# Patient Record
Sex: Male | Born: 1950
Health system: Southern US, Community
[De-identification: ages and names within clinical notes are randomized; demographics above are authoritative.]

## PROBLEM LIST (undated history)

## (undated) DIAGNOSIS — F1020 Alcohol dependence, uncomplicated: Secondary | ICD-10-CM

## (undated) DIAGNOSIS — C61 Malignant neoplasm of prostate: Secondary | ICD-10-CM

## (undated) DIAGNOSIS — K219 Gastro-esophageal reflux disease without esophagitis: Secondary | ICD-10-CM

## (undated) DIAGNOSIS — F32A Depression, unspecified: Secondary | ICD-10-CM

## (undated) HISTORY — DX: Depression, unspecified: F32.A

## (undated) HISTORY — PX: NOSE SURGERY: SHX723

## (undated) HISTORY — DX: Alcohol dependence, uncomplicated: F10.20

---

## 2009-06-08 ENCOUNTER — Emergency Department (HOSPITAL_COMMUNITY): Admission: EM | Admit: 2009-06-08 | Discharge: 2009-06-08 | Payer: Self-pay | Admitting: Emergency Medicine

## 2011-10-10 ENCOUNTER — Emergency Department (HOSPITAL_COMMUNITY): Payer: Self-pay

## 2011-10-10 ENCOUNTER — Emergency Department (HOSPITAL_COMMUNITY)
Admission: EM | Admit: 2011-10-10 | Discharge: 2011-10-10 | Disposition: A | Discharge status: Home / Self Care | Payer: Self-pay | Attending: Emergency Medicine | Admitting: Emergency Medicine

## 2011-10-10 ENCOUNTER — Encounter (HOSPITAL_COMMUNITY): Payer: Self-pay | Admitting: *Deleted

## 2011-10-10 DIAGNOSIS — S61259A Open bite of unspecified finger without damage to nail, initial encounter: Secondary | ICD-10-CM

## 2011-10-10 DIAGNOSIS — W540XXA Bitten by dog, initial encounter: Secondary | ICD-10-CM | POA: Insufficient documentation

## 2011-10-10 DIAGNOSIS — S61209A Unspecified open wound of unspecified finger without damage to nail, initial encounter: Secondary | ICD-10-CM | POA: Insufficient documentation

## 2011-10-10 DIAGNOSIS — Y92009 Unspecified place in unspecified non-institutional (private) residence as the place of occurrence of the external cause: Secondary | ICD-10-CM | POA: Insufficient documentation

## 2011-10-10 DIAGNOSIS — K219 Gastro-esophageal reflux disease without esophagitis: Secondary | ICD-10-CM | POA: Insufficient documentation

## 2011-10-10 DIAGNOSIS — F17200 Nicotine dependence, unspecified, uncomplicated: Secondary | ICD-10-CM | POA: Insufficient documentation

## 2011-10-10 HISTORY — DX: Gastro-esophageal reflux disease without esophagitis: K21.9

## 2011-10-10 MED ORDER — AMOXICILLIN-POT CLAVULANATE 875-125 MG PO TABS: 1.0000 | ORAL_TABLET | Freq: Two times a day (BID) | ORAL | Status: AC

## 2011-10-10 MED ORDER — HYDROCODONE-ACETAMINOPHEN 5-325 MG PO TABS: ORAL_TABLET | ORAL | Status: DC

## 2011-10-10 MED ORDER — ONDANSETRON HCL 4 MG PO TABS
4.0000 mg | ORAL_TABLET | Freq: Once | ORAL | Status: AC
  Administered 2011-10-10: 4 mg via ORAL
  Filled 2011-10-10: qty 1

## 2011-10-10 MED ORDER — AMOXICILLIN-POT CLAVULANATE 875-125 MG PO TABS
1.0000 | ORAL_TABLET | Freq: Once | ORAL | Status: AC
  Administered 2011-10-10: 1 via ORAL
  Filled 2011-10-10: qty 1

## 2011-10-10 MED ORDER — BACITRACIN ZINC 500 UNIT/GM EX OINT
TOPICAL_OINTMENT | CUTANEOUS | Status: AC
  Filled 2011-10-10: qty 0.9

## 2011-10-10 MED ORDER — DIPHTH-ACELL PERTUSSIS-TETANUS 25-58-10 LF-MCG/0.5 IM SUSP
0.5000 mL | Freq: Once | INTRAMUSCULAR | Status: AC
  Administered 2011-10-10: 0.5 mL via INTRAMUSCULAR
  Filled 2011-10-10: qty 0.5

## 2011-10-10 MED ORDER — HYDROCODONE-ACETAMINOPHEN 5-325 MG PO TABS
2.0000 | ORAL_TABLET | Freq: Once | ORAL | Status: AC
  Administered 2011-10-10: 2 via ORAL
  Filled 2011-10-10: qty 2

## 2011-10-10 MED ORDER — BACITRACIN-NEOMYCIN-POLYMYXIN 400-5-5000 EX OINT
TOPICAL_OINTMENT | Freq: Once | CUTANEOUS | Status: AC
  Administered 2011-10-10: 12:00:00 via TOPICAL

## 2011-10-10 NOTE — ED Notes (Signed)
Cleansed hand and wound.  Bacitracin, telfa,bandage, Splint applied to protect wound.

## 2011-10-10 NOTE — ED Notes (Signed)
Animal control spoke with pt by phone.

## 2011-10-10 NOTE — Discharge Instructions (Signed)
Please cleanse the wound daily with soap and water. Apply dressing and splint until healed. Keep wound clean and dry. Please call Dr Romeo Apple for follow up of the finger in the office. Please return to the Emergency Dept on Sunday, April 28, for recheck.Animal Bite An animal bite can result in a scratch on the skin, deep open cut, puncture of the skin, crush injury, or tearing away of the skin or a body part. Dogs are responsible for most animal bites. Children are bitten more often than adults. An animal bite can range from very mild to more serious. A small bite from your house pet is no cause for alarm. However, some animal bites can become infected or injure a bone or other tissue. You must seek medical care if:  The skin is broken and bleeding does not slow down or stop after 15 minutes.   The puncture is deep and difficult to clean (such as a cat bite).   Pain, warmth, redness, or pus develops around the wound.   The bite is from a stray animal or rodent. There may be a risk of rabies infection.   The bite is from a snake, raccoon, skunk, fox, coyote, or bat. There may be a risk of rabies infection.   The person bitten has a chronic illness such as diabetes, liver disease, or cancer, or the person takes medicine that lowers the immune system.   There is concern about the location and severity of the bite.  It is important to clean and protect an animal bite wound right away to prevent infection. Follow these steps:  Clean the wound with plenty of water and soap.   Apply an antibiotic cream.   Apply gentle pressure over the wound with a clean towel or gauze to slow or stop bleeding.   Elevate the affected area above the heart to help stop any bleeding.   Seek medical care. Getting medical care within 8 hours of the animal bite leads to the best possible outcome.  DIAGNOSIS  Your caregiver will most likely:  Take a detailed history of the animal and the bite injury.   Perform a  wound exam.   Take your medical history.  Blood tests or X-rays may be performed. Sometimes, infected bite wounds are cultured and sent to a lab to identify the infectious bacteria.  TREATMENT  Medical treatment will depend on the location and type of animal bite as well as the patient's medical history. Treatment may include:  Wound care, such as cleaning and flushing the wound with saline solution, bandaging, and elevating the affected area.   Antibiotics.   Tetanus immunization.   Rabies immunization.   Leaving the wound open to heal. This is often done with animal bites, due to the high risk of infection. However, in certain cases, wound closure with stitches, wound adhesive, skin adhesive strips, or staples may be used.  Infected bites that are left untreated may require intravenous (IV) antibiotics and surgical treatment in the hospital. HOME CARE INSTRUCTIONS  Follow your caregiver's instructions for wound care.   Take all medicines as directed.   If your caregiver prescribes antibiotics, take them as directed. Finish them even if you start to feel better.   Follow up with your caregiver for further exams or immunizations as directed.  You may need a tetanus shot if:  You cannot remember when you had your last tetanus shot.   You have never had a tetanus shot.   The injury broke  your skin.  If you get a tetanus shot, your arm may swell, get red, and feel warm to the touch. This is common and not a problem. If you need a tetanus shot and you choose not to have one, there is a rare chance of getting tetanus. Sickness from tetanus can be serious. SEEK MEDICAL CARE IF:  You notice warmth, redness, soreness, swelling, pus discharge, or a bad smell coming from the wound.   You have a red line on the skin coming from the wound.   You have a fever, chills, or a general ill feeling.   You have nausea or vomiting.   You have continued or worsening pain.   You have trouble  moving the injured part.   You have other questions or concerns.  MAKE SURE YOU:  Understand these instructions.   Will watch your condition.   Will get help right away if you are not doing well or get worse.  Document Released: 02/18/2011 Document Revised: 05/22/2011 Document Reviewed: 02/18/2011 [facility] Patient Information 2012 [place], [place].

## 2011-10-10 NOTE — ED Notes (Signed)
Pt alert, NAD, dog bite to rt middle finger. Area is bandaged and no bleeding at present.

## 2011-10-10 NOTE — ED Provider Notes (Signed)
History     CSN: [id number]  Arrival date & time 10/10/11  1005   First MD Initiated Contact with Patient 10/10/11 1058      Chief Complaint  Patient presents with  . Animal Bite    (Consider location/radiation/quality/duration/timing/severity/associated sxs/prior treatment) HPI Comments: Dog owner reports the dog is UTD on all shots.  Patient is a 61 y.o. male presenting with animal bite. The history is provided by the patient.  Animal Bite  The incident occurred just prior to arrival. The incident occurred at another residence. He came to the ER via personal transport. Head/neck injury location: none. Torso Injury Location: none. There is an injury to the right long finger. Leg Injury Location: none. Toe Injury Location: none. The pain is moderate. It is unlikely that a foreign body is present. Pertinent negatives include no chest pain, no numbness, no abdominal pain, no nausea, no vomiting, no bladder incontinence, no neck pain, no focal weakness, no seizures, no tingling and no cough. There have been no prior injuries to these areas. He is right-handed. His tetanus status is out of date. He has been behaving normally. He has received no recent medical care.    Past Medical History  Diagnosis Date  . GERD (gastroesophageal reflux disease)     History reviewed. No pertinent past surgical history.  No family history on file.  History  Substance Use Topics  . Smoking status: Current Everyday Smoker  . Smokeless tobacco: Not on file  . Alcohol Use: Yes      Review of Systems  Constitutional: Negative for activity change.       All ROS Neg except as noted in HPI  HENT: Negative for nosebleeds and neck pain.   Eyes: Negative for photophobia and discharge.  Respiratory: Negative for cough, shortness of breath and wheezing.   Cardiovascular: Negative for chest pain and palpitations.  Gastrointestinal: Negative for nausea, vomiting, abdominal pain and blood in stool.    Genitourinary: Negative for bladder incontinence, dysuria, frequency and hematuria.  Musculoskeletal: Negative for back pain and arthralgias.  Skin: Negative.   Neurological: Negative for dizziness, tingling, focal weakness, seizures, speech difficulty and numbness.  Psychiatric/Behavioral: Negative for hallucinations and confusion.    Allergies  Review of patient's allergies indicates no known allergies.  Home Medications   Current Outpatient Rx  Name Route Sig Dispense Refill  . AMOXICILLIN-POT CLAVULANATE 875-125 MG PO TABS Oral Take 1 tablet by mouth 2 (two) times daily after a meal. 14 tablet 0  . HYDROCODONE-ACETAMINOPHEN 5-325 MG PO TABS  1 or 2 po q4h prn pain, take with food 20 tablet 0    BP 123/91  Pulse 80  Temp 97.8 F (36.6 C)  Resp 18  Ht 5' 8.5" (1.74 m)  Wt 135 lb (61.236 kg)  BMI 20.23 kg/m2  SpO2 100%  Physical Exam  Nursing note and vitals reviewed. Constitutional: He is oriented to person, place, and time. He appears well-developed and well-nourished.  Non-toxic appearance.  HENT:  Head: Normocephalic.  Right Ear: Tympanic membrane and external ear normal.  Left Ear: Tympanic membrane and external ear normal.  Eyes: EOM and lids are normal. Pupils are equal, round, and reactive to light.  Neck: Normal range of motion. Neck supple. Carotid bruit is not present.  Cardiovascular: Normal rate, regular rhythm, normal heart sounds, intact distal pulses and normal pulses.   Pulmonary/Chest: Breath sounds normal. No respiratory distress.  Abdominal: Soft. Bowel sounds are normal. There is no tenderness. There is  no guarding.  Musculoskeletal: Normal range of motion.       The tip of the right middle finger is denuded. There is a laceration of the distal tip. No bone involvement seen. Good ROM noted.  Lymphadenopathy:       Head (right side): No submandibular adenopathy present.       Head (left side): No submandibular adenopathy present.    He has no  cervical adenopathy.  Neurological: He is alert and oriented to person, place, and time. He has normal strength. No cranial nerve deficit or sensory deficit.  Skin: Skin is warm and dry.  Psychiatric: He has a normal mood and affect. His speech is normal.    ED Course  Procedures (including critical care time)  Labs Reviewed - No data to display Dg Finger Middle Right  10/10/2011  *RADIOLOGY REPORT*  Clinical Data: Dog bite to middle finger.  Pain and swelling.  RIGHT MIDDLE FINGER 2+V  Comparison: None.  Findings: Soft tissue swelling is seen, however there is no evidence of soft tissue gas or radiopaque foreign body.  No evidence of fracture or other bone abnormality.  IMPRESSION: Soft tissue swelling.  No evidence of fracture or radiopaque foreign body.  Original Report Authenticated By: Danae Orleans, M.D.     1. Animal bite of finger       MDM  I have reviewed nursing notes, vital signs, and all appropriate lab and imaging results for this patient. Xray negative for fracture or fb. Pt to return on 4/28 for recheck.Rx for augmentin and norco given to the patient.       Kathie Dike, PA 10/10/11 1200  Kathie Dike, [place] 10/10/11 1230

## 2011-10-10 NOTE — ED Notes (Signed)
Pt was working in the yard when the dog grabbed the glove that he had own biting through the glove and into the right middle finger, bandage applied, bleeding controlled at present,  Pt states that his last tetanus shot was >5 years ago and that the dog has had all his shots per the owner of the dog, Joseph Gentry.

## 2011-10-11 NOTE — ED Provider Notes (Signed)
Evaluation and management procedures were performed by the PA/NP/resident physician under my supervision/collaboration.   Brett Darko D Shameer Molstad, MD 10/11/11 2055 

## 2011-10-12 ENCOUNTER — Encounter (HOSPITAL_COMMUNITY): Payer: Self-pay | Admitting: *Deleted

## 2011-10-12 ENCOUNTER — Emergency Department (HOSPITAL_COMMUNITY)
Admission: EM | Admit: 2011-10-12 | Discharge: 2011-10-12 | Disposition: A | Discharge status: Home / Self Care | Payer: Self-pay | Attending: Emergency Medicine | Admitting: Emergency Medicine

## 2011-10-12 DIAGNOSIS — K219 Gastro-esophageal reflux disease without esophagitis: Secondary | ICD-10-CM | POA: Insufficient documentation

## 2011-10-12 DIAGNOSIS — S61259A Open bite of unspecified finger without damage to nail, initial encounter: Secondary | ICD-10-CM

## 2011-10-12 DIAGNOSIS — IMO0002 Reserved for concepts with insufficient information to code with codable children: Secondary | ICD-10-CM | POA: Insufficient documentation

## 2011-10-12 DIAGNOSIS — W540XXA Bitten by dog, initial encounter: Secondary | ICD-10-CM | POA: Insufficient documentation

## 2011-10-12 DIAGNOSIS — F17200 Nicotine dependence, unspecified, uncomplicated: Secondary | ICD-10-CM | POA: Insufficient documentation

## 2011-10-12 NOTE — ED Provider Notes (Signed)
History    This chart was scribed for Osvaldo Human, MD by Toya Smothers. The patient was seen in room APA17/APA17. Patient's care was started at 1006.   CSN: [id number]  Arrival date & time 10/12/11  1006   First MD Initiated Contact with Patient 10/12/11 1019      Chief Complaint  Patient presents with  . Wound Check    (Consider location/radiation/quality/duration/timing/severity/associated sxs/prior treatment) HPI Joseph Gentry is a 61 y.o. male who presents to the Emergency Department to reevaluate wound on the tip of the right third finger from dog bite 2 days ago. Patient was seen 2 days ago for an animal bite and prescribed Augmentin and told to return for a reevaluation today. Patient denies fever and chills. Patient is scheduled to follow up with Dr. Romeo Apple.   Past Medical History  Diagnosis Date  . GERD (gastroesophageal reflux disease)     History reviewed. No pertinent past surgical history.  No family history on file.  History  Substance Use Topics  . Smoking status: Current Everyday Smoker  . Smokeless tobacco: Not on file  . Alcohol Use: Yes      Review of Systems  Constitutional: Negative for fever and chills.  HENT: Negative for rhinorrhea and neck pain.   Eyes: Negative for pain.  Respiratory: Negative for cough and shortness of breath.   Cardiovascular: Negative for chest pain.  Gastrointestinal: Negative for nausea, vomiting, abdominal pain and diarrhea.  Genitourinary: Negative for dysuria.  Musculoskeletal: Negative for back pain.  Skin: Positive for wound. Negative for rash.  Neurological: Negative for dizziness and weakness.    Allergies  Review of patient's allergies indicates no known allergies.  Home Medications   Current Outpatient Rx  Name Route Sig Dispense Refill  . AMOXICILLIN-POT CLAVULANATE 875-125 MG PO TABS Oral Take 1 tablet by mouth 2 (two) times daily after a meal. 14 tablet 0  . HYDROCODONE-ACETAMINOPHEN 5-325  MG PO TABS Oral Take 1 tablet by mouth every 4 (four) hours as needed. Pain    . OMEPRAZOLE 20 MG PO CPDR Oral Take 20 mg by mouth daily as needed. Stomach Acid      BP 116/77  Pulse 87  Temp(Src) 97.5 F (36.4 C) (Oral)  Resp 20  SpO2 94%  Physical Exam  Nursing note and vitals reviewed. Constitutional: He is oriented to person, place, and time. He appears well-developed and well-nourished. No distress.  HENT:  Head: Normocephalic and atraumatic.  Eyes: EOM are normal. Pupils are equal, round, and reactive to light.  Neck: Neck supple. No tracheal deviation present.  Cardiovascular: Normal rate.   Pulmonary/Chest: Effort normal. No respiratory distress.  Abdominal: Soft. He exhibits no distension.  Musculoskeletal: Normal range of motion. He exhibits no edema.  Neurological: He is alert and oriented to person, place, and time. No sensory deficit.  Skin: Skin is warm and dry.       Wound on right third finger with no signs of infection  Psychiatric: He has a normal mood and affect. His behavior is normal.    ED Course  Procedures (including critical care time) DIAGNOSTIC STUDIES: Oxygen Saturation is 94% on room air, low by my interpretation.    COORDINATION OF CARE:  10:40AM Reevaluation redressing of wound  Labs Reviewed - No data to display Dg Finger Middle Right  10/10/2011  *RADIOLOGY REPORT*  Clinical Data: Dog bite to middle finger.  Pain and swelling.  RIGHT MIDDLE FINGER 2+V  Comparison: None.  Findings:  Soft tissue swelling is seen, however there is no evidence of soft tissue gas or radiopaque foreign body.  No evidence of fracture or other bone abnormality.  IMPRESSION: Soft tissue swelling.  No evidence of fracture or radiopaque foreign body.  Original Report Authenticated By: Danae Orleans, M.D.    Exam of pt's fingertip shows no evidence of infection.  Redressed with Telfa and Tubegauze.  Advised followup with Dr. Romeo Apple, orthopedist to whom he was referred  when seen initially.      1. Dog bite of finger   2. Fingertip amputation     I personally performed the services described in this documentation, which was scribed in my presence. The recorded information has been reviewed and considered.  Osvaldo Human, M.D     Carleene Cooper III, MD 10/12/11 2108

## 2011-10-12 NOTE — Discharge Instructions (Signed)
Finger Avulsion  When the tip of the finger is lost, a new nail may grow back if part of the fingernail is left. The new nail may be deformed. If just the tip of the finger is lost, no repair may be needed unless there is bone showing. If bone is showing, your caregiver may need to remove the protruding bone and put on a bandage. Your caregiver will do what is best for you. Most of the time when a fingertip is lost, the end will gradually grow back on and look fairly normal, but it may remain sensitive to pressure and temperature extremes for a long time. HOME CARE INSTRUCTIONS   Keep your hand elevated above your heart to relieve pain and swelling.   Keep your dressing dry and clean.   Change your bandage in 24 hours or as directed.   After your bandage is changed, soak your hand in warm soapy water for 10 to 15 minutes. Do this 3 times per day. This helps reduce pain and swelling.   After soaking your hand, apply a clean, dry bandage. Change your bandage if it is wet or dirty.   Only take over-the-counter or prescription medicines for pain, discomfort, or fever as directed by your caregiver.   See your caregiver as needed for problems.  SEEK MEDICAL CARE IF:   You have increased pain, swelling, drainage, or bleeding.   You have a fever.   You have swelling that spreads from your finger and into your hand.  Make sure to check to see if you need a tetanus booster. Document Released: 08/11/2001 Document Revised: 05/22/2011 Document Reviewed: 07/06/2008 [employer] Patient Information 2012 [employer]. 

## 2011-10-12 NOTE — ED Notes (Signed)
Bitten by a dog on Friday to left 2nd finger. Here for recheck.

## 2011-10-15 ENCOUNTER — Encounter: Payer: Self-pay | Admitting: Orthopedic Surgery

## 2011-10-15 ENCOUNTER — Ambulatory Visit (INDEPENDENT_AMBULATORY_CARE_PROVIDER_SITE_OTHER): Payer: Self-pay | Admitting: Orthopedic Surgery

## 2011-10-15 VITALS — Ht 68.5 in | Wt 135.0 lb

## 2011-10-15 DIAGNOSIS — T148XXA Other injury of unspecified body region, initial encounter: Secondary | ICD-10-CM

## 2011-10-15 DIAGNOSIS — S61209A Unspecified open wound of unspecified finger without damage to nail, initial encounter: Secondary | ICD-10-CM

## 2011-10-15 DIAGNOSIS — W540XXA Bitten by dog, initial encounter: Secondary | ICD-10-CM

## 2011-10-15 DIAGNOSIS — S61259A Open bite of unspecified finger without damage to nail, initial encounter: Secondary | ICD-10-CM

## 2011-10-15 MED ORDER — HYDROCODONE-ACETAMINOPHEN 5-325 MG PO TABS: 1.0000 | ORAL_TABLET | ORAL | Status: DC | PRN

## 2011-10-15 NOTE — Patient Instructions (Signed)
Keep clean and dry

## 2011-10-15 NOTE — Progress Notes (Signed)
  Subjective:    Joseph Gentry is a 61 y.o. male who presents for evaluation of dog bite, RIGHT long finger  Date of injury April 26.  The patient was doing some yard work and the dog bit his finger avulsing the volar tip of the finger soft tissue only. He is on antibiotic. He had a tetanus shot is up to him pain medicine. He is here for evaluation. Pain is 3/10. Timing is intermittent and symptoms/description    Systems he does complain of some cough and heartburn.  Evaluation of the digit, RIGHT long finger, volar aspect is avulsed soft tissue only, clean granulation tissue, and no deficits in terms of range of motion or flexion or extension. Power  Neurovascular exam intact   Redressed with Xeroform and gauze.  Dressing change in one week continue antibiotic

## 2011-10-22 ENCOUNTER — Ambulatory Visit (INDEPENDENT_AMBULATORY_CARE_PROVIDER_SITE_OTHER): Payer: Self-pay | Admitting: Orthopedic Surgery

## 2011-10-22 ENCOUNTER — Encounter: Payer: Self-pay | Admitting: Orthopedic Surgery

## 2011-10-22 VITALS — BP 106/62 | Ht 68.5 in | Wt 135.0 lb

## 2011-10-22 DIAGNOSIS — T148XXA Other injury of unspecified body region, initial encounter: Secondary | ICD-10-CM

## 2011-10-22 DIAGNOSIS — S61209A Unspecified open wound of unspecified finger without damage to nail, initial encounter: Secondary | ICD-10-CM

## 2011-10-22 DIAGNOSIS — W540XXA Bitten by dog, initial encounter: Secondary | ICD-10-CM

## 2011-10-22 NOTE — Patient Instructions (Signed)
Change daily

## 2011-10-22 NOTE — Progress Notes (Signed)
Patient ID: Joseph Gentry, male   DOB: 09/27/1950, 62 y.o.   MRN: [id number] Chief Complaint  Patient presents with  . Follow-up    1 week recheck and dressing change on right long finger.    The finger has improved significantly in the tissue is granulating well.  We will put a splint on and change the dressing. He should change dressing once a day, apply a Band-Aid and splint. Follow up in 2 weeks

## 2011-11-05 ENCOUNTER — Ambulatory Visit: Payer: Self-pay | Admitting: Orthopedic Surgery

## 2015-12-18 ENCOUNTER — Emergency Department (HOSPITAL_COMMUNITY)
Admission: EM | Admit: 2015-12-18 | Discharge: 2015-12-18 | Disposition: A | Discharge status: Home / Self Care | Payer: Medicare Other | Attending: Emergency Medicine | Admitting: Emergency Medicine

## 2015-12-18 ENCOUNTER — Emergency Department (HOSPITAL_COMMUNITY): Payer: Medicare Other

## 2015-12-18 ENCOUNTER — Encounter (HOSPITAL_COMMUNITY): Payer: Self-pay | Admitting: Emergency Medicine

## 2015-12-18 DIAGNOSIS — Z79899 Other long term (current) drug therapy: Secondary | ICD-10-CM | POA: Diagnosis not present

## 2015-12-18 DIAGNOSIS — F17210 Nicotine dependence, cigarettes, uncomplicated: Secondary | ICD-10-CM | POA: Diagnosis not present

## 2015-12-18 DIAGNOSIS — R0602 Shortness of breath: Secondary | ICD-10-CM | POA: Insufficient documentation

## 2015-12-18 DIAGNOSIS — Z7982 Long term (current) use of aspirin: Secondary | ICD-10-CM | POA: Diagnosis not present

## 2015-12-18 DIAGNOSIS — R079 Chest pain, unspecified: Secondary | ICD-10-CM | POA: Diagnosis not present

## 2015-12-18 DIAGNOSIS — R1013 Epigastric pain: Secondary | ICD-10-CM | POA: Insufficient documentation

## 2015-12-18 LAB — COMPREHENSIVE METABOLIC PANEL
ALBUMIN: 4 g/dL (ref 3.5–5.0)
ALT: 17 U/L (ref 17–63)
AST: 31 U/L (ref 15–41)
Alkaline Phosphatase: 96 U/L (ref 38–126)
Anion gap: 12 (ref 5–15)
BUN: 6 mg/dL (ref 6–20)
CHLORIDE: 101 mmol/L (ref 101–111)
CO2: 24 mmol/L (ref 22–32)
CREATININE: 0.99 mg/dL (ref 0.61–1.24)
Calcium: 9.8 mg/dL (ref 8.9–10.3)
GFR calc Af Amer: >60 mL/min (ref >60)
GFR calc non Af Amer: >60 mL/min (ref >60)
GLUCOSE: 82 mg/dL (ref 65–99)
POTASSIUM: 3.4 mmol/L — AB (ref 3.5–5.1)
Sodium: 137 mmol/L (ref 135–145)
Total Bilirubin: 0.4 mg/dL (ref 0.3–1.2)
Total Protein: 7.1 g/dL (ref 6.5–8.1)

## 2015-12-18 LAB — CBC WITH DIFFERENTIAL/PLATELET
BASOS ABS: 0.1 10*3/uL (ref 0.0–0.1)
Basophils Relative: 1 %
EOS PCT: 5 %
Eosinophils Absolute: 0.4 10*3/uL (ref 0.0–0.7)
HEMATOCRIT: 40 % (ref 39.0–52.0)
Hemoglobin: 14 g/dL (ref 13.0–17.0)
LYMPHS ABS: 1.7 10*3/uL (ref 0.7–4.0)
LYMPHS PCT: 26 %
MCH: 35.1 pg — AB (ref 26.0–34.0)
MCHC: 35 g/dL (ref 30.0–36.0)
MCV: 100.3 fL — AB (ref 78.0–100.0)
MONO ABS: 0.6 10*3/uL (ref 0.1–1.0)
MONOS PCT: 10 %
NEUTROS ABS: 3.7 10*3/uL (ref 1.7–7.7)
Neutrophils Relative %: 58 %
Platelets: 229 10*3/uL (ref 150–400)
RBC: 3.99 MIL/uL — ABNORMAL LOW (ref 4.22–5.81)
RDW: 13.8 % (ref 11.5–15.5)
WBC: 6.4 10*3/uL (ref 4.0–10.5)

## 2015-12-18 LAB — I-STAT TROPONIN, ED: TROPONIN I, POC: 0.01 ng/mL (ref 0.00–0.08)

## 2015-12-18 LAB — ETHANOL: Alcohol, Ethyl (B): 56 mg/dL — ABNORMAL HIGH (ref <5)

## 2015-12-18 LAB — LIPASE, BLOOD: LIPASE: 20 U/L (ref 11–51)

## 2015-12-18 MED ORDER — ONDANSETRON HCL 4 MG/2ML IJ SOLN
4.0000 mg | Freq: Once | INTRAMUSCULAR | Status: AC
  Administered 2015-12-18: 4 mg via INTRAVENOUS
  Filled 2015-12-18: qty 2

## 2015-12-18 MED ORDER — SODIUM CHLORIDE 0.9 % IV BOLUS (SEPSIS)
500.0000 mL | Freq: Once | INTRAVENOUS | Status: AC
  Administered 2015-12-18: 500 mL via INTRAVENOUS

## 2015-12-18 MED ORDER — DIATRIZOATE MEGLUMINE & SODIUM 66-10 % PO SOLN
ORAL | Status: DC
Start: 2015-12-18 — End: 2015-12-19
  Filled 2015-12-18: qty 30

## 2015-12-18 MED ORDER — STERILE WATER FOR INJECTION IJ SOLN
INTRAMUSCULAR | Status: AC
  Administered 2015-12-18: 21:00:00
  Filled 2015-12-18: qty 10

## 2015-12-18 MED ORDER — IOPAMIDOL (ISOVUE-300) INJECTION 61%
100.0000 mL | Freq: Once | INTRAVENOUS | Status: AC | PRN
  Administered 2015-12-18: 100 mL via INTRAVENOUS

## 2015-12-18 MED ORDER — FAMOTIDINE 20 MG PO TABS: 20.0000 mg | ORAL_TABLET | Freq: Two times a day (BID) | ORAL | Status: DC

## 2015-12-18 MED ORDER — PANTOPRAZOLE SODIUM 40 MG IV SOLR
40.0000 mg | Freq: Once | INTRAVENOUS | Status: AC
  Administered 2015-12-18: 40 mg via INTRAVENOUS
  Filled 2015-12-18: qty 40

## 2015-12-18 MED ORDER — SODIUM CHLORIDE 0.9 % IV SOLN
INTRAVENOUS | Status: DC
Start: 2015-12-18 — End: 2015-12-19
  Administered 2015-12-18: 19:00:00 via INTRAVENOUS

## 2015-12-18 NOTE — ED Notes (Signed)
Pt is nondiaphoretic. Nad. Mm moist.

## 2015-12-18 NOTE — ED Provider Notes (Signed)
CSN: [id number]     Arrival date & time 12/18/15  1701 History   First MD Initiated Contact with Patient 12/18/15 1710     Chief Complaint  Patient presents with  . Chest Pain     (Consider location/radiation/quality/duration/timing/severity/associated sxs/prior Treatment) Patient is a 65 y.o. male presenting with chest pain. The history is provided by the patient.  Chest Pain Associated symptoms: abdominal pain, diaphoresis, fever, nausea and shortness of breath   Associated symptoms: no back pain, no headache and not vomiting   Patient with 3-4 day complaint of epigastric abdominal pain that radiates in the lower chest into the lower abdomen. Patient's had a history of gastric ulcer in the past. Patient drinks alcohol daily. Patient states that he's had nausea without vomiting, no diarrhea, has had diaphoresis and has had a fever feeling. Also showed initiated with shortness of breath  Past Medical History  Diagnosis Date  . GERD (gastroesophageal reflux disease)    History reviewed. No pertinent past surgical history. History reviewed. No pertinent family history. Social History  Substance Use Topics  . Smoking status: Current Every Day Smoker -- 1.00 packs/day    Types: Cigarettes  . Smokeless tobacco: None  . Alcohol Use: Yes     Comment: daily    Review of Systems  Constitutional: Positive for fever and diaphoresis.  HENT: Negative for congestion.   Eyes: Negative for visual disturbance.  Respiratory: Positive for shortness of breath.   Cardiovascular: Positive for chest pain. Negative for leg swelling.  Gastrointestinal: Positive for nausea and abdominal pain. Negative for vomiting.  Genitourinary: Negative for dysuria.  Musculoskeletal: Negative for back pain.  Skin: Negative for rash.  Neurological: Negative for headaches.  Hematological: Does not bruise/bleed easily.  Psychiatric/Behavioral: Negative for confusion.      Allergies  Review of patient's  allergies indicates no known allergies.  Home Medications   Prior to Admission medications   Medication Sig Start Date End Date Taking? Authorizing Provider  Aspirin-Salicylamide-Caffeine (BC HEADACHE) 325-95-16 MG TABS Take 1 packet by mouth daily as needed (for pain).   Yes Historical Provider, MD  ibuprofen (ADVIL,MOTRIN) 200 MG tablet Take 200-400 mg by mouth every 6 (six) hours as needed for moderate pain.   Yes Historical Provider, MD  ranitidine (ZANTAC) 75 MG tablet Take 75 mg by mouth daily as needed for heartburn.   Yes Historical Provider, MD  famotidine (PEPCID) 20 MG tablet Take 1 tablet (20 mg total) by mouth 2 (two) times daily. 12/18/15   Fredia Sorrow, MD   BP 112/75 mmHg  Pulse 77  Temp(Src) 97.5 F (36.4 C) (Oral)  Resp 18  Ht 5\' 8"  (1.727 m)  Wt 56.7 kg  BMI 19.01 kg/m2  SpO2 98% Physical Exam  Constitutional: He is oriented to person, place, and time. He appears well-developed and well-nourished. No distress.  HENT:  Head: Normocephalic and atraumatic.  Mouth/Throat: Oropharynx is clear and moist.  Eyes: Conjunctivae and EOM are normal. Pupils are equal, round, and reactive to light.  Neck: Normal range of motion. Neck supple.  Cardiovascular: Normal rate, regular rhythm and normal heart sounds.   No murmur heard. Pulmonary/Chest: Effort normal and breath sounds normal. No respiratory distress.  Abdominal: Soft. Bowel sounds are normal. There is no tenderness.  Musculoskeletal: Normal range of motion. He exhibits no edema.  Neurological: He is alert and oriented to person, place, and time. No cranial nerve deficit. He exhibits normal muscle tone. Coordination normal.  Skin: Skin is warm. No  rash noted.  Nursing note and vitals reviewed.   ED Course  Procedures (including critical care time) Labs Review Labs Reviewed  COMPREHENSIVE METABOLIC PANEL - Abnormal; Notable for the following:    Potassium 3.4 (*)    All other components within normal limits   CBC WITH DIFFERENTIAL/PLATELET - Abnormal; Notable for the following:    RBC 3.99 (*)    MCV 100.3 (*)    MCH 35.1 (*)    All other components within normal limits  ETHANOL - Abnormal; Notable for the following:    Alcohol, Ethyl (B) 56 (*)    All other components within normal limits  LIPASE, BLOOD  URINALYSIS, ROUTINE W REFLEX MICROSCOPIC (NOT AT [facility])  URINE RAPID DRUG SCREEN, HOSP PERFORMED  I-STAT TROPOININ, ED  I-STAT CHEM 8, ED  I-STAT TROPOININ, ED   Results for orders placed or performed during the hospital encounter of 12/18/15  Comprehensive metabolic panel  Result Value Ref Range   Sodium 137 135 - 145 mmol/L   Potassium 3.4 (L) 3.5 - 5.1 mmol/L   Chloride 101 101 - 111 mmol/L   CO2 24 22 - 32 mmol/L   Glucose, Bld 82 65 - 99 mg/dL   BUN 6 6 - 20 mg/dL   Creatinine, Ser 0.99 0.61 - 1.24 mg/dL   Calcium 9.8 8.9 - 10.3 mg/dL   Total Protein 7.1 6.5 - 8.1 g/dL   Albumin 4.0 3.5 - 5.0 g/dL   AST 31 15 - 41 U/L   ALT 17 17 - 63 U/L   Alkaline Phosphatase 96 38 - 126 U/L   Total Bilirubin 0.4 0.3 - 1.2 mg/dL   GFR calc non Af Amer >60 >60 mL/min   GFR calc Af Amer >60 >60 mL/min   Anion gap 12 5 - 15  Lipase, blood  Result Value Ref Range   Lipase 20 11 - 51 U/L  CBC with Differential/Platelet  Result Value Ref Range   WBC 6.4 4.0 - 10.5 K/uL   RBC 3.99 (L) 4.22 - 5.81 MIL/uL   Hemoglobin 14.0 13.0 - 17.0 g/dL   HCT 40.0 39.0 - 52.0 %   MCV 100.3 (H) 78.0 - 100.0 fL   MCH 35.1 (H) 26.0 - 34.0 pg   MCHC 35.0 30.0 - 36.0 g/dL   RDW 13.8 11.5 - 15.5 %   Platelets 229 150 - 400 K/uL   Neutrophils Relative % 58 %   Neutro Abs 3.7 1.7 - 7.7 K/uL   Lymphocytes Relative 26 %   Lymphs Abs 1.7 0.7 - 4.0 K/uL   Monocytes Relative 10 %   Monocytes Absolute 0.6 0.1 - 1.0 K/uL   Eosinophils Relative 5 %   Eosinophils Absolute 0.4 0.0 - 0.7 K/uL   Basophils Relative 1 %   Basophils Absolute 0.1 0.0 - 0.1 K/uL  Ethanol  Result Value Ref Range   Alcohol, Ethyl (B)  56 (H) <5 mg/dL  I-Stat Troponin, ED (not at [facility])  Result Value Ref Range   Troponin i, poc 0.01 0.00 - 0.08 ng/mL   Comment 3             Imaging Review Dg Chest 2 View  12/18/2015  CLINICAL DATA:  Shortness of breath and epigastric pain for 1 week EXAM: CHEST  2 VIEW COMPARISON:  June 08, 2009. FINDINGS: There is no edema or consolidation. The heart size and pulmonary vascularity are within normal limits. No adenopathy. There is atherosclerotic calcification in the aortic arch. No  bone lesions. IMPRESSION: No edema or consolidation.  Aortic atherosclerosis. Electronically Signed   By: Lowella Grip III M.D.   On: 12/18/2015 18:38   Ct Abdomen Pelvis W Contrast  12/18/2015  CLINICAL DATA:  Epigastric pain radiating to the back. Nausea and diaphoresis. Shortness of breath. Symptoms for 1 week. EXAM: CT ABDOMEN AND PELVIS WITH CONTRAST TECHNIQUE: Multidetector CT imaging of the abdomen and pelvis was performed using the standard protocol following bolus administration of intravenous contrast. CONTRAST:  118mL ISOVUE-300 IOPAMIDOL (ISOVUE-300) INJECTION 61% COMPARISON:  None. FINDINGS: Lower chest: Mild dependent atelectasis. Mild emphysema. No pleural effusion. Liver: No focal lesion. Scattered calcifications about the liver capsule of the left lobe. Hepatobiliary: Gallbladder minimally distended. No calcified stone. No biliary dilatation. Pancreas: No ductal dilatation or inflammation. Pancreas is partially obscured by adjacent decompressed bowel loops and paucity of intra-abdominal fat. Spleen: Normal. Adrenal glands: No nodule. Kidneys: Symmetric renal enhancement and excretion. No hydronephrosis. Stomach/Bowel: Questionable wall thickening at the gastroesophageal junction. Stomach physiologically distended. There are no dilated or thickened small bowel loops. Small volume of stool throughout the colon without colonic wall thickening. Sigmoid colon is tortuous. The appendix is air-filled and  normal where visualized. Vascular/Lymphatic: No retroperitoneal adenopathy. Abdominal aorta is normal in caliber. Moderate atherosclerosis without aneurysm. Reproductive: Heterogeneous prostate gland. Bladder: Physiologically distended, no wall thickening. Other: No free air, free fluid, or intra-abdominal fluid collection. Musculoskeletal: There are 6 non-rib-bearing lumbar vertebra as described on December 2010 lumbar spine radiographs. Compression deformity of previously labeled L4 has progressed in the interim with increased loss of height anteriorly. There are bilateral pars defects as the lower most lumbar vertebra, which will be labeled S1 given previous numbering. No listhesis. There are no acute or suspicious osseous abnormalities. IMPRESSION: 1. Possible wall thickening at the gastroesophageal junction, can be seen with reflux. 2. Otherwise no acute abnormality in the abdomen/pelvis. 3. Abdominal aortic atherosclerosis without aneurysm. Electronically Signed   By: Jeb Levering M.D.   On: 12/18/2015 19:38   I have personally reviewed and evaluated these images and lab results as part of my medical decision-making.   EKG Interpretation   Date/Time:  Tuesday December 18 2015 17:09:37 EDT Ventricular Rate:  83 PR Interval:    QRS Duration: 87 QT Interval:  389 QTC Calculation: 458 R Axis:   68 Text Interpretation:  Sinus arrhythmia No previous ECGs available  Confirmed by Arjun Hard  MD, Thedora Rings [phone]) on 12/18/2015 5:16:23 PM Also  confirmed by Rogene Houston  MD, Domingos Riggi [phone])  on 12/18/2015 5:20:06 PM      MDM   Final diagnoses:  Abdominal pain, epigastric    Patient presented with the complaint of epigastric abdominal pain radiated to the lower abdomen and into the lower part of the chest. Patient states is been going on for 3-4 days. As been constant. Patient states that he drinks alcohol daily he's had a peptic ulcer in the past. And he last smoked crack 3 days ago.  Workup here troponin  was negative EKG without any acute findings. Chest x-ray negative for pneumonia pneumothorax or pulmonary edema. CT of the abdomen showed some inflammation at the GE junction. Will treat patient with Pepcid for the next 2 weeks give referral to GI medicine for an upper endoscopy to rule out recurrent peptic ulcer disease.    Fredia Sorrow, MD 12/18/15 2113

## 2015-12-18 NOTE — ED Notes (Signed)
To CT via wheelchair

## 2015-12-18 NOTE — ED Notes (Signed)
Pt has eaten crackers and has had sprite without problems

## 2015-12-18 NOTE — ED Notes (Signed)
Pt states he has been having epigastric/chest pain going into back with nausea, diaphoresis, and shortness of breath for almost a week.  Drinks daily and last smoked crack 3 days ago.

## 2015-12-18 NOTE — ED Notes (Signed)
Pt finished contrast. Ct aware. 

## 2015-12-18 NOTE — Discharge Instructions (Signed)
Take the Pepcid as directed for the next 2 weeks. Make an appointment to follow-up with GI medicine Dr. Olevia Perches office. Return for any new or worse symptoms.

## 2015-12-18 NOTE — ED Notes (Signed)
Pt demanding something to eat- appears increasingly anxious- provided reassurance, crackers, and sprite over ice chips

## 2015-12-18 NOTE — ED Notes (Signed)
From CT 

## 2015-12-19 LAB — I-STAT CHEM 8, ED
BUN: 4 mg/dL — AB (ref 6–20)
CREATININE: 1 mg/dL (ref 0.61–1.24)
Calcium, Ion: 1.24 mmol/L — ABNORMAL HIGH (ref 1.12–1.23)
Chloride: 100 mmol/L — ABNORMAL LOW (ref 101–111)
GLUCOSE: 83 mg/dL (ref 65–99)
HCT: 46 % (ref 39.0–52.0)
HEMOGLOBIN: 15.6 g/dL (ref 13.0–17.0)
POTASSIUM: 3.6 mmol/L (ref 3.5–5.1)
Sodium: 138 mmol/L (ref 135–145)
TCO2: 26 mmol/L (ref 0–100)

## 2015-12-25 ENCOUNTER — Encounter (INDEPENDENT_AMBULATORY_CARE_PROVIDER_SITE_OTHER): Payer: Self-pay | Admitting: Internal Medicine

## 2016-01-03 ENCOUNTER — Encounter (INDEPENDENT_AMBULATORY_CARE_PROVIDER_SITE_OTHER): Payer: Self-pay | Admitting: Internal Medicine

## 2016-01-03 ENCOUNTER — Ambulatory Visit (INDEPENDENT_AMBULATORY_CARE_PROVIDER_SITE_OTHER): Payer: Medicare Other | Admitting: Internal Medicine

## 2017-07-03 ENCOUNTER — Emergency Department (HOSPITAL_COMMUNITY)
Admission: EM | Admit: 2017-07-03 | Discharge: 2017-07-03 | Disposition: A | Discharge status: Home / Self Care | Payer: Medicare PPO | Attending: Emergency Medicine | Admitting: Emergency Medicine

## 2017-07-03 ENCOUNTER — Encounter (HOSPITAL_COMMUNITY): Payer: Self-pay | Admitting: Emergency Medicine

## 2017-07-03 ENCOUNTER — Other Ambulatory Visit: Payer: Self-pay

## 2017-07-03 DIAGNOSIS — Y939 Activity, unspecified: Secondary | ICD-10-CM | POA: Insufficient documentation

## 2017-07-03 DIAGNOSIS — S56912A Strain of unspecified muscles, fascia and tendons at forearm level, left arm, initial encounter: Secondary | ICD-10-CM | POA: Insufficient documentation

## 2017-07-03 DIAGNOSIS — W010XXA Fall on same level from slipping, tripping and stumbling without subsequent striking against object, initial encounter: Secondary | ICD-10-CM | POA: Insufficient documentation

## 2017-07-03 DIAGNOSIS — Y92009 Unspecified place in unspecified non-institutional (private) residence as the place of occurrence of the external cause: Secondary | ICD-10-CM | POA: Insufficient documentation

## 2017-07-03 DIAGNOSIS — Y999 Unspecified external cause status: Secondary | ICD-10-CM | POA: Insufficient documentation

## 2017-07-03 DIAGNOSIS — Z79899 Other long term (current) drug therapy: Secondary | ICD-10-CM | POA: Insufficient documentation

## 2017-07-03 DIAGNOSIS — T148XXA Other injury of unspecified body region, initial encounter: Secondary | ICD-10-CM

## 2017-07-03 DIAGNOSIS — S56901A Unspecified injury of unspecified muscles, fascia and tendons at forearm level, right arm, initial encounter: Secondary | ICD-10-CM | POA: Insufficient documentation

## 2017-07-03 DIAGNOSIS — F17210 Nicotine dependence, cigarettes, uncomplicated: Secondary | ICD-10-CM | POA: Insufficient documentation

## 2017-07-03 DIAGNOSIS — S59919A Unspecified injury of unspecified forearm, initial encounter: Secondary | ICD-10-CM | POA: Diagnosis present

## 2017-07-03 MED ORDER — TRAMADOL HCL 50 MG PO TABS: 50.0000 mg | ORAL_TABLET | Freq: Four times a day (QID) | ORAL | 0 refills | Status: DC | PRN

## 2017-07-03 MED ORDER — TRAMADOL HCL 50 MG PO TABS
50.0000 mg | ORAL_TABLET | Freq: Once | ORAL | Status: AC
  Administered 2017-07-03: 50 mg via ORAL
  Filled 2017-07-03: qty 1

## 2017-07-03 MED ORDER — KETOROLAC TROMETHAMINE 30 MG/ML IJ SOLN
30.0000 mg | Freq: Once | INTRAMUSCULAR | Status: AC
  Administered 2017-07-03: 30 mg via INTRAMUSCULAR
  Filled 2017-07-03: qty 1

## 2017-07-03 NOTE — ED Provider Notes (Signed)
[facility] EMERGENCY DEPARTMENT Provider Note   CSN: [id number] Arrival date & time: 07/03/17  1350     History   Chief Complaint Chief Complaint  Patient presents with  . Fall    HPI Joseph Gentry is a 67 y.o. male with no significant past medical history presenting with bilateral forearm pain since he tripped and fell early this morning while working on a wood pile at his mothers home. He landed in grass with his elbows in flexion, with the weight of the fall landing on his forearms. He initially had bilateral shoulder pain which has resolved.  He denies neck, head, upper arm or hand pain and can fully move his shoulders, elbows and wrists without pain.  He describes his pain as aching in the bilateral upper forearm muscles. He has had no treatment prior to arriving here.   The history is provided by the patient.    Past Medical History:  Diagnosis Date  . GERD (gastroesophageal reflux disease)     Patient Active Problem List   Diagnosis Date Noted  . Dog bite of finger 10/15/2011    History reviewed. No pertinent surgical history.     Home Medications    Prior to Admission medications   Medication Sig Start Date End Date Taking? Authorizing Provider  ranitidine (ZANTAC) 75 MG tablet Take 75 mg by mouth daily as needed for heartburn.   Yes [provider]  traMADol (ULTRAM) 50 MG tablet Take 1 tablet (50 mg total) by mouth every 6 (six) hours as needed. 07/03/17   Evalee Jefferson, PA-C    Family History History reviewed. No pertinent family history.  Social History Social History   Tobacco Use  . Smoking status: Current Every Day Smoker    Packs/day: 1.00    Types: Cigarettes  . Smokeless tobacco: Never Used  Substance Use Topics  . Alcohol use: Yes    Alcohol/week: 3.6 oz    Types: 6 Cans of beer per week    Comment: daily  . Drug use: Yes    Types: Cocaine, Marijuana     Allergies   Patient has no known allergies.   Review of  Systems Review of Systems  Constitutional: Negative for fever.  Musculoskeletal: Positive for arthralgias. Negative for joint swelling and myalgias.  Neurological: Negative for weakness and numbness.     Physical Exam Updated Vital Signs BP 124/85 (BP Location: Right Arm)   Pulse (!) 107   Temp 97.9 F (36.6 C) (Oral)   Resp 18   Ht 5' 8.5" (1.74 m)   Wt 61.2 kg (135 lb)   SpO2 95%   BMI 20.23 kg/m   Physical Exam  Constitutional: He appears well-developed and well-nourished.  HENT:  Head: Atraumatic. Head is without contusion.  Neck: Normal range of motion.  Cardiovascular:  Pulses:      Radial pulses are 2+ on the right side, and 2+ on the left side.  Pulses equal bilaterally  Musculoskeletal: He exhibits tenderness. He exhibits no deformity.       Cervical back: He exhibits no bony tenderness.  Pt has FROM of bilateral shoulders, elbow, wrists and hand/fingers without eliciting pain.  He is point tender over his medial proximal forearms bilaterally. No spasm, edema, deformity, bruising.  Neurological: He is alert. He has normal strength. He displays normal reflexes. No sensory deficit.  Equal grip strength.  Skin: Skin is warm and dry.  Psychiatric: He has a normal mood and affect.  ED Treatments / Results  Labs (all labs ordered are listed, but only abnormal results are displayed) Labs Reviewed - No data to display  EKG  EKG Interpretation None       Radiology No results found.  Procedures Procedures (including critical care time)  Medications Ordered in ED Medications  ketorolac (TORADOL) 30 MG/ML injection 30 mg (30 mg Intramuscular Given 07/03/17 1635)  traMADol (ULTRAM) tablet 50 mg (50 mg Oral Given 07/03/17 1636)     Initial Impression / Assessment and Plan / ED Course  I have reviewed the triage vital signs and the nursing notes.  Pertinent labs & imaging results that were available during my care of the patient were reviewed by me and  considered in my medical decision making (see chart for details).     Pt with fall and bilateral soft tissue forearm pain. No exam findings suggesting fracture/dislocation.  He was advised ice tx, resting the arms.  Given small supply of tramadol. Advised recheck for any worsened or persistent sx.  Referrals given to establish pcp.   [place] controlled substance database reviewed.   Final Clinical Impressions(s) / ED Diagnoses   Final diagnoses:  Muscle strain    ED Discharge Orders        Ordered    traMADol (ULTRAM) 50 MG tablet  Every 6 hours PRN     07/03/17 1647       Evalee Jefferson, PA-C 07/03/17 1703    Milton Ferguson, MD 07/04/17 1413

## 2017-07-03 NOTE — ED Triage Notes (Signed)
Patient states he tripped and fell while gathering wood. Reports tingling in both arms afterward. No LOC. Patient states he fell face forward.

## 2017-07-03 NOTE — Discharge Instructions (Signed)
Your exam is reassuring tonight for no broken bones but I do suspect you will have some soreness in your arms for a few days.  You may take the medicine prescribed -use caution as it will make you sleepy.  Apply ice packs to your arms throughout the day as this can help with pain also.

## 2018-03-30 ENCOUNTER — Encounter: Payer: Self-pay | Admitting: General Surgery

## 2018-03-30 ENCOUNTER — Ambulatory Visit: Payer: Medicare PPO | Admitting: General Surgery

## 2018-03-30 VITALS — BP 127/85 | HR 92 | Temp 97.8°F | Resp 18 | Wt 119.6 lb

## 2018-03-30 DIAGNOSIS — L290 Pruritus ani: Secondary | ICD-10-CM | POA: Diagnosis not present

## 2018-03-30 NOTE — Patient Instructions (Signed)
Anal Pruritus Anal pruritus is an itchy feeling in the anus and the skin in the anal area. This is common and can be caused by many things. It often occurs when the area becomes moist. Moisture may be due to sweating or a small amount of stool (feces) that is left on the area because of poor personal cleaning. Some other causes include:  Perfumed soaps and sprays.  Colored toilet paper.  Chemicals in the foods that you eat.  Dietary factors, such as caffeine, beer, milk products, chocolate, nuts, citrus fruits, tomatoes, spicy seasonings, jalapeno peppers, and salsa.  Hemorrhoids, fissures, infections, and other anal diseases.  Excessive washing.  Overuse of laxatives.  Skin disorders (psoriasis, eczema, or seborrhea).  Some medical disorders, such as diabetes or thyroid problems.  Diarrhea.  STDs (sexually transmitted diseases).  Some cancers.  In many cases, the cause is not known. The itching usually goes away with treatment and home care. Scratching can cause further skin damage. Follow these instructions at home: Pay attention to any changes in your symptoms. Take these actions to help with your itching: Skin Care  Practice good hygiene. ? Clean the anal area gently with wet toilet paper, baby wipes, or a wet washcloth after every bowel movement and at bedtime. ? Avoid using soaps on the anal area. ? Dry the area thoroughly. Pat the area dry with toilet paper or a towel.  Do not scrub the anal area with anything, including toilet paper.  Do not scratch the itchy area. Scratching produces more damage and makes the itching worse.  Take sitz baths in warm water as told by your health care provider. Pat the area dry with a soft cloth after each bath.  Use creams or ointments as told by your health care provider. Zinc oxide ointment or a moisture barrier cream can be applied several times per day to protect the skin.  Do not use anything that irritates the skin, such as  bubble baths, scented toilet paper, or genital deodorants. General instructions  Take over-the-counter and prescription medicines only as told by your health care provider.  Talk with your health care provider about fiber supplements. These are helpful in keeping your stool normal if you have frequent loose stools.  Wear cotton underwear and loose clothing.  Keep all follow-up visits as told by your health care provider. This is important. Contact a health care provider if:  Your itching does not improve in several days.  Your itching gets worse.  You have a fever.  You have redness, swelling, or pain in the anal area.  You have fluid, blood, or pus coming from the anal area. This information is not intended to replace advice given to you by your health care provider. Make sure you discuss any questions you have with your health care provider. Document Released: 12/02/2010 Document Revised: 11/08/2015 Document Reviewed: 08/28/2014 Elsevier Interactive Patient Education  [employer].

## 2018-03-31 NOTE — Progress Notes (Signed)
Joseph Gentry; [id number]; 1950/07/28   HPI Patient is a 67 year old black male who was referred to my care by Dr. Legrand Rams for evaluation treatment of rectal pain.  Patient states he has had the rectal pain intermittently for a few months now.  It is not made worse with bowel movements.  He does sometimes have problems swallowing his underwear after having a bowel movement.  He denies any diarrhea or constipation.  He denies any blood per rectum.  He states his anus itches frequently.  When I further drilled down as to whether he has pain or itching, he states he complains more of itching.  He was recently noted to have an elevated PSA level as supposed to see a urologist.  He has never had a colonoscopy.  He states he has a pain level 6 out of 10, but again he is more irritated about the itching that he has around his anus.  He does not have to strain when using his bowels.  He has never had rectal surgery.  He has tried various creams. Past Medical History:  Diagnosis Date  . GERD (gastroesophageal reflux disease)     History reviewed. No pertinent surgical history.  History reviewed. No pertinent family history.  Current Outpatient Medications on File Prior to Visit  Medication Sig Dispense Refill  . omeprazole (PRILOSEC) 40 MG capsule Take 40 mg by mouth daily.    . traMADol (ULTRAM) 50 MG tablet Take 1 tablet (50 mg total) by mouth every 6 (six) hours as needed. 15 tablet 0   No current facility-administered medications on file prior to visit.     No Known Allergies  Social History   Substance and Sexual Activity  Alcohol Use Yes  . Alcohol/week: 6.0 standard drinks  . Types: 6 Cans of beer per week   Comment: daily    Social History   Tobacco Use  Smoking Status Current Every Day Smoker  . Packs/day: 1.00  . Types: Cigarettes  Smokeless Tobacco Never Used    Review of Systems  Constitutional: Negative.   HENT: Negative.   Eyes: Positive for blurred vision.   Respiratory: Positive for cough and shortness of breath.   Cardiovascular: Negative.   Gastrointestinal: Positive for heartburn.  Genitourinary: Positive for frequency.  Musculoskeletal: Positive for joint pain.  Neurological: Positive for sensory change.  Endo/Heme/Allergies: Negative.   Psychiatric/Behavioral: Negative.     Objective   Vitals:   03/30/18 1032  BP: 127/85  Pulse: 92  Resp: 18  Temp: 97.8 F (36.6 C)    Physical Exam  Constitutional: He is oriented to person, place, and time. He appears well-developed and well-nourished. No distress.  HENT:  Head: Normocephalic and atraumatic.  Cardiovascular: Normal rate, regular rhythm and normal heart sounds. Exam reveals no gallop and no friction rub.  No murmur heard. Pulmonary/Chest: Effort normal and breath sounds normal. No stridor. No respiratory distress. He has no wheezes. He has no rales.  Abdominal: Soft. Bowel sounds are normal. He exhibits no distension and no mass. There is no tenderness. There is no guarding.  Genitourinary:  Genitourinary Comments: Rectal examination reveals moist somewhat fragile skin around the anus.  The prostate is noted to be enlarged and nodular in nature.  No hemorrhoidal disease or anal fissures appreciated.  Sphincter tone is fair.  No strictures present.  No blood noted.  Neurological: He is alert and oriented to person, place, and time.  Skin: Skin is warm and dry.  Vitals reviewed. Dr.  Dub Mikes notes reviewed  Assessment  Pruritus ani. Plan   Patient suffers more from pruritus ani that he does from any specific rectal pain.  An enlarged prostate could cause some muscle spasm issues.  I told the patient that he must wipe himself clean and pat himself dry.  He may use hydrocortisone cream on occasion.  He will need a colonoscopy as he has never had one.  Awaiting further work-up by urology.  Will follow patient expectantly.

## 2018-05-06 ENCOUNTER — Other Ambulatory Visit (HOSPITAL_COMMUNITY): Payer: Self-pay | Admitting: Respiratory Therapy

## 2018-05-06 ENCOUNTER — Ambulatory Visit (HOSPITAL_COMMUNITY)
Admission: RE | Admit: 2018-05-06 | Discharge: 2018-05-06 | Disposition: A | Discharge status: Home / Self Care | Payer: Medicare PPO | Source: Ambulatory Visit | Attending: Pulmonary Disease | Admitting: Pulmonary Disease

## 2018-05-06 ENCOUNTER — Other Ambulatory Visit (HOSPITAL_COMMUNITY): Payer: Self-pay | Admitting: Pulmonary Disease

## 2018-05-06 DIAGNOSIS — R918 Other nonspecific abnormal finding of lung field: Secondary | ICD-10-CM | POA: Insufficient documentation

## 2018-05-06 DIAGNOSIS — R936 Abnormal findings on diagnostic imaging of limbs: Secondary | ICD-10-CM | POA: Diagnosis not present

## 2018-05-06 DIAGNOSIS — R0602 Shortness of breath: Secondary | ICD-10-CM | POA: Diagnosis present

## 2018-05-06 DIAGNOSIS — M25561 Pain in right knee: Secondary | ICD-10-CM | POA: Diagnosis not present

## 2018-05-06 DIAGNOSIS — M7989 Other specified soft tissue disorders: Secondary | ICD-10-CM | POA: Insufficient documentation

## 2018-05-18 ENCOUNTER — Ambulatory Visit: Payer: Medicare PPO | Admitting: Urology

## 2018-05-18 DIAGNOSIS — R3915 Urgency of urination: Secondary | ICD-10-CM

## 2018-05-18 DIAGNOSIS — R972 Elevated prostate specific antigen [PSA]: Secondary | ICD-10-CM | POA: Diagnosis not present

## 2018-05-26 ENCOUNTER — Ambulatory Visit (HOSPITAL_COMMUNITY): Admission: RE | Admit: 2018-05-26 | Payer: Medicare PPO | Source: Ambulatory Visit

## 2018-06-28 DIAGNOSIS — F17210 Nicotine dependence, cigarettes, uncomplicated: Secondary | ICD-10-CM | POA: Diagnosis not present

## 2018-06-28 DIAGNOSIS — R972 Elevated prostate specific antigen [PSA]: Secondary | ICD-10-CM | POA: Diagnosis not present

## 2018-06-28 DIAGNOSIS — K6289 Other specified diseases of anus and rectum: Secondary | ICD-10-CM | POA: Diagnosis not present

## 2018-06-29 ENCOUNTER — Ambulatory Visit (HOSPITAL_COMMUNITY): Payer: Medicare PPO

## 2018-06-29 ENCOUNTER — Other Ambulatory Visit: Payer: Self-pay | Admitting: Urology

## 2018-06-29 ENCOUNTER — Other Ambulatory Visit (HOSPITAL_COMMUNITY): Payer: Self-pay | Admitting: Urology

## 2018-06-29 ENCOUNTER — Encounter (HOSPITAL_COMMUNITY): Payer: Self-pay

## 2018-06-29 DIAGNOSIS — R972 Elevated prostate specific antigen [PSA]: Secondary | ICD-10-CM

## 2018-08-04 DIAGNOSIS — R972 Elevated prostate specific antigen [PSA]: Secondary | ICD-10-CM | POA: Diagnosis not present

## 2018-08-10 ENCOUNTER — Other Ambulatory Visit (HOSPITAL_COMMUNITY): Payer: Self-pay | Admitting: Urology

## 2018-08-10 DIAGNOSIS — C61 Malignant neoplasm of prostate: Secondary | ICD-10-CM

## 2018-08-23 ENCOUNTER — Encounter (HOSPITAL_COMMUNITY)
Admission: RE | Admit: 2018-08-23 | Discharge: 2018-08-23 | Disposition: A | Discharge status: Home / Self Care | Payer: Medicare PPO | Source: Ambulatory Visit | Attending: Urology | Admitting: Urology

## 2018-08-23 ENCOUNTER — Other Ambulatory Visit (HOSPITAL_COMMUNITY): Payer: Self-pay | Admitting: Urology

## 2018-08-23 ENCOUNTER — Ambulatory Visit (HOSPITAL_COMMUNITY)
Admission: RE | Admit: 2018-08-23 | Discharge: 2018-08-23 | Disposition: A | Discharge status: Home / Self Care | Payer: Medicare PPO | Source: Ambulatory Visit | Attending: Urology | Admitting: Urology

## 2018-08-23 DIAGNOSIS — C61 Malignant neoplasm of prostate: Secondary | ICD-10-CM

## 2018-08-23 DIAGNOSIS — R972 Elevated prostate specific antigen [PSA]: Secondary | ICD-10-CM | POA: Diagnosis not present

## 2018-08-23 MED ORDER — TECHNETIUM TC 99M MEDRONATE IV KIT
21.3000 | PACK | Freq: Once | INTRAVENOUS | Status: AC | PRN
  Administered 2018-08-23: 21.3 via INTRAVENOUS

## 2018-08-30 ENCOUNTER — Encounter: Payer: Self-pay | Admitting: Medical Oncology

## 2018-09-01 DIAGNOSIS — R972 Elevated prostate specific antigen [PSA]: Secondary | ICD-10-CM | POA: Diagnosis not present

## 2018-09-01 DIAGNOSIS — C61 Malignant neoplasm of prostate: Secondary | ICD-10-CM | POA: Diagnosis not present

## 2018-09-02 ENCOUNTER — Encounter: Payer: Self-pay | Admitting: Medical Oncology

## 2018-09-03 ENCOUNTER — Encounter: Payer: Self-pay | Admitting: Radiation Oncology

## 2018-09-03 DIAGNOSIS — R109 Unspecified abdominal pain: Secondary | ICD-10-CM | POA: Diagnosis not present

## 2018-09-03 DIAGNOSIS — C61 Malignant neoplasm of prostate: Secondary | ICD-10-CM | POA: Diagnosis not present

## 2018-09-03 NOTE — Progress Notes (Signed)
GU Location of Tumor / Histology: prostatic adenocarcinoma  If Prostate Cancer, Gleason Score is (4 + 5) and PSA is (79.4) on 03/25/2018. Repeat PSA on 05/18/2018 was 84.2. Prostate volume: 22.19 grams.  Joseph Gentry was referred by Dr. Lemmie Evens to Dr. Diona Fanti in December 2019 for further evaluation of an elevated PSA.   Biopsies of prostate (if applicable) revealed:    Past/Anticipated interventions by urology, if any: prostate biopsy, ct abd/pelvis (negative), bone scan (Right tenth rib), referral to [facility]  Past/Anticipated interventions by medical oncology, if any: no  Weight changes, if any: yes, 20-30 lb weight in 1 year  Bowel/Bladder complaints, if any: Reports urinary urgency, frequency and incontinence associated with urgency.    Nausea/Vomiting, if any:   Pain issues, if any:    SAFETY ISSUES:  Prior radiation?   Pacemaker/ICD?   Possible current pregnancy? no, male patient  Is the patient on methotrexate?   Current Complaints / other details:  68 year old male. Single. Everyday smoker. NKDA. 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 2 daughters.  Patient had a virtual visit. Nursing assessment was not done by this RN.

## 2018-09-03 NOTE — Progress Notes (Signed)
Attempted to reach patient regarding referral to [facility]. No voicemail to leave message. I did mail patient packet of information and medical forms about clinic.

## 2018-09-06 ENCOUNTER — Telehealth: Payer: Self-pay | Admitting: Medical Oncology

## 2018-09-06 NOTE — Telephone Encounter (Signed)
Spoke with patient's daughter Zigmund Gottron to confirm appointment for [facility]. I explained the risks of pandemic COVID-19 transmission are causing [place] to to transition some patients from clinic visits to telehealth or  telephone visits. She states he father is in [place] and has not been home to receive the packet of information I mailed. She is willing to do the telehealth conference tomorrow. She asked if the physicians can start calling around 2 pm. I gave her my office number and asked her to call me if she has questions or concerns. She voiced understanding.

## 2018-09-07 ENCOUNTER — Inpatient Hospital Stay: Payer: Medicare PPO | Attending: Oncology | Admitting: Oncology

## 2018-09-07 ENCOUNTER — Encounter: Payer: Self-pay | Admitting: Medical Oncology

## 2018-09-07 ENCOUNTER — Ambulatory Visit
Admission: RE | Admit: 2018-09-07 | Discharge: 2018-09-07 | Disposition: A | Discharge status: Home / Self Care | Payer: Medicare PPO | Source: Ambulatory Visit | Attending: Radiation Oncology | Admitting: Radiation Oncology

## 2018-09-07 ENCOUNTER — Other Ambulatory Visit: Payer: Self-pay

## 2018-09-07 DIAGNOSIS — C61 Malignant neoplasm of prostate: Secondary | ICD-10-CM

## 2018-09-07 DIAGNOSIS — R972 Elevated prostate specific antigen [PSA]: Secondary | ICD-10-CM | POA: Diagnosis not present

## 2018-09-07 HISTORY — DX: Malignant neoplasm of prostate: C61

## 2018-09-07 NOTE — Progress Notes (Signed)
Virtual Visit via Video Note  I connected with Joseph Gentry on 09/07/18 at  1:15 PM EDT by a video enabled telemedicine application and verified that I am speaking with the correct person using two identifiers.   I discussed the limitations of evaluation and management by telemedicine and the availability of in person appointments. The patient expressed understanding and agreed to proceed.  History of Present Illness:  68 year old gentleman with prostate cancer diagnosed in February 2020.  He presented with a PSA of 79.4 and a biopsy obtained by Dr. Diona Fanti on February 19 confirmed the presence of Gleason score 4+5 = 9 with high volume disease and Gleason score 4+3 = 7 pattern noted as well.  Imaging studies did not show any evidence of metastatic disease.  He has reported urinary frequency and nocturia that is exacerbated by alcohol intake.    Observations/Objective:  Imaging studies as well as pathology results were reviewed today and discussed with the reviewing pathologist as well as radiologist via virtual prostate cancer meeting.  Imaging studies did not show any convincing evidence of documented metastatic disease.   Assessment and Plan:  68 year old gentleman with high risk prostate cancer presenting with PSA 79 and a Gleason score 4+5 = 9 with high volume disease.  Treatment options were reviewed today in the prostate cancer multidisciplinary clinic which was reviewed today with the patient and his 2 daughters.  His options would be primary surgical therapy versus radiation with long-term ADT.  He understands if he elects to proceed with surgical therapy he will likely require additional treatment with adjuvant radiation therapy and possible androgen deprivation.  He also understands that his risk of developing metastatic disease is high without any treatment in the near future.  His best chance of disease control at this point will be active treatment at the current state.  He  understands if he develops advanced metastatic disease this can happen to the bone as well as lymph nodes.  At that time additional therapy is palliative and will not be curative.  Although the chance of curing his disease remains low at this time.  I see no need for any further escalation of systemic therapy beyond androgen deprivation at this time.  Follow Up Instructions:    I discussed the assessment and treatment plan with the patient. The patient was provided an opportunity to ask questions and all were answered. The patient agreed with the plan and demonstrated an understanding of the instructions.   The patient was advised to call back or seek an in-person if he develop worsening metastatic disease in the future.  I provided 20 minutes of non-face-to-face time during this encounter.   Zola Button, MD

## 2018-09-07 NOTE — Progress Notes (Signed)
                               Care Plan Summary of Telehealth Visit- due to COVID-19  Name: Joseph Gentry DOB: [birth year]   Your Medical Team:   Urologist -  Dr. Raynelle Bring, [facility]  Radiation Oncologist - Dr. Tyler Pita, [facility]   Medical Oncologist - Dr. Zola Button, [place]  Recommendations: 1) Androgen deprivation  2) 5 1/2 weeks of radiations with seed boost or 8 weeks of radiation    * These recommendations are based on information available as of today's consult.      Recommendations may change depending on the results of further tests or exams.    Next Steps: 1) Consider options Cira Rue, RN Navigator will follow up with you regarding treatment decision)    When appointments need to be scheduled, you will be contacted by [facility] and/or [facility].  Questions?  Please do not hesitate to call Cira Rue, RN, BSN, OCN at [phone] any questions or concerns.  Shirlean Mylar is your Oncology Nurse Navigator and is available to assist you while you're receiving your medical care at [facility].

## 2018-09-07 NOTE — Progress Notes (Signed)
Radiation Oncology         [phone] ________________________________  Multidisciplinary Prostate Cancer Clinic  Initial Radiation Oncology Consultation - Conducted via Webex due to current COVID-19 concerns for limiting patient exposure  Name: Joseph Gentry MRN: [id number]  Date: 09/07/2018  DOB: [birth year]  PO:[id number], Brandon Melnick, MD  Raynelle Bring, MD   REFERRING PHYSICIAN: Raynelle Bring, MD  DIAGNOSIS: 68 y.o. gentleman with stage T1c adenocarcinoma of the prostate with a Gleason's score of 4+5 and a PSA of 84.2    ICD-10-CM   1. Malignant neoplasm of prostate ([place]) C61     HISTORY OF PRESENT ILLNESS::Joseph Gentry is a 68 y.o. gentleman with a diagnosis of prostate cancer.  He was noted to have an elevated PSA of 79.4 in October 2019 by his primary care physician, Dr. Rosita Fire.  Accordingly, he was referred for evaluation in urology to Dr. Diona Fanti on 05/18/2018, and a digital rectal examination was performed revealing a mildly irregular prostate but not nodular or suspicious-feeling.  Repeat PSA at that time was further elevated at 84.2.  The patient proceeded to transrectal ultrasound with 12 biopsies of the prostate on 08/04/2018.  The prostate volume measured 22.19 cc.  Out of 12 core biopsies, 10 were positive.  The maximum Gleason score was 4+5, and this was seen in the right apex.  Additionally, there was Gleason 4+4 disease in the right mid lateral, Gleason 4+3 disease in the left base lateral, left mid lateral, left base, left apex, right mid, right base lateral, and right apex lateral and Gleason 3+4 in the left apex lateral.  Biopsies of prostate revealed:     The patient underwent a CT scan of the abdomen/pelvis on 08/23/2018 which showed heterogeneous enhancement of the left prostate, corresponding to the patient's newly diagnosed prostate cancer. No findings specific for metastatic disease. His bone scan, performed same day, showed heterogeneous posterior right  activity, most pronounced on the right at approximately the 10th rib level. This is indeterminate. No other suspicious radiotracer activity identified.        The patient reviewed the biopsy results with his urologist and he has kindly been referred today to the multidisciplinary prostate cancer clinic for presentation of pathology and radiology studies in our conference for discussion of potential radiation treatment options and clinical evaluation.  PREVIOUS RADIATION THERAPY: No  PAST MEDICAL HISTORY:  has a past medical history of GERD (gastroesophageal reflux disease) and Prostate cancer ([place]).    PAST SURGICAL HISTORY:History reviewed. No pertinent surgical history.  FAMILY HISTORY: family history is not on file.  SOCIAL HISTORY:  reports that he has been smoking cigarettes. He has been smoking about 1.00 pack per day. He has never used smokeless tobacco. He reports current alcohol use of about 6.0 standard drinks of alcohol per week. He reports current drug use. Drugs: Cocaine and Marijuana. Single. 2 daughters. 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  ALLERGIES: Patient has no known allergies.  MEDICATIONS:  Current Outpatient Medications  Medication Sig Dispense Refill   clotrimazole-betamethasone (LOTRISONE) cream APP EXT TO RASH BID  3   levofloxacin (LEVAQUIN) 750 MG tablet TK 1 T PO MORNING OF BIOPSY     omeprazole (PRILOSEC) 40 MG capsule Take 40 mg by mouth daily.     traMADol (ULTRAM) 50 MG tablet Take 1 tablet (50 mg total) by mouth every 6 (six) hours as needed. 15 tablet 0   VENTOLIN HFA 108 (90 Base) MCG/ACT inhaler      No current facility-administered medications for  this encounter.     REVIEW OF SYSTEMS:  On review of systems, the patient reports that he is doing well overall. He denies any chest pain, shortness of breath, cough, fevers, chills, or night sweats. He reports 20-30 pound weight loss in the past year. He denies any bowel disturbances, and denies abdominal pain,  nausea or vomiting. He denies any new musculoskeletal or joint aches or pains. He reports urinary urgency, frequency, and incontinence associated with urgency. A complete review of systems is obtained and is otherwise negative.   PHYSICAL EXAM:  Wt Readings from Last 3 Encounters:  03/30/18 119 lb 9.6 oz (54.3 kg)  07/03/17 135 lb (61.2 kg)  12/18/15 125 lb (56.7 kg)   Temp Readings from Last 3 Encounters:  03/30/18 97.8 F (36.6 C) (Temporal)  07/03/17 97.9 F (36.6 C) (Oral)  12/18/15 97.5 F (36.4 C) (Oral)   BP Readings from Last 3 Encounters:  03/30/18 127/85  07/03/17 129/90  12/18/15 106/76   Pulse Readings from Last 3 Encounters:  03/30/18 92  07/03/17 89  12/18/15 75    In general this is a well appearing African-American male in no acute distress. He is alert and oriented x4. Remainder of physical exam deferred due to virtual consult.  KPS = 80  100 - Normal; no complaints; no evidence of disease. 90   - Able to carry on normal activity; minor signs or symptoms of disease. 80   - Normal activity with effort; some signs or symptoms of disease. 66   - Cares for self; unable to carry on normal activity or to do active work. 60   - Requires occasional assistance, but is able to care for most of his personal needs. 50   - Requires considerable assistance and frequent medical care. 84   - Disabled; requires special care and assistance. 107   - Severely disabled; hospital admission is indicated although death not imminent. 17   - Very sick; hospital admission necessary; active supportive treatment necessary. 10   - Moribund; fatal processes progressing rapidly. 0     - Dead  Karnofsky DA, Abelmann [place], Craver LS and Burchenal [facility] [phone]) The use of the nitrogen mustards in the palliative treatment of carcinoma: with particular reference to bronchogenic carcinoma Cancer 1 634-56   LABORATORY DATA:  Lab Results  Component Value Date   WBC 6.4 12/18/2015   HGB 15.6  12/18/2015   HCT 46.0 12/18/2015   MCV 100.3 (H) 12/18/2015   PLT 229 12/18/2015   Lab Results  Component Value Date   NA 138 12/18/2015   K 3.6 12/18/2015   CL 100 (L) 12/18/2015   CO2 24 12/18/2015   Lab Results  Component Value Date   ALT 17 12/18/2015   AST 31 12/18/2015   ALKPHOS 96 12/18/2015   BILITOT 0.4 12/18/2015     RADIOGRAPHY: Dg Chest 2 View  Result Date: 08/23/2018 CLINICAL DATA:  Prostate cancer. EXAM: CHEST - 2 VIEW COMPARISON:  Chest radiographs 05/06/2018. CT abdomen and pelvis 08/23/2018. Nuclear medicine whole-body bone scan 08/23/2018. FINDINGS: The cardiomediastinal silhouette is within normal limits. Aortic atherosclerosis is noted. The lungs are hyperinflated and clear. There is no evidence of pleural effusion or pneumothorax. No acute osseous abnormality or destructive osseous lesion is identified. A chronic lumbar compression fracture is again noted. IMPRESSION: Hyperinflation without evidence of active cardiopulmonary disease. Electronically Signed   By: Logan Bores M.D.   On: 08/23/2018 16:37   Nm Bone Scan Whole Body  Result Date: 08/23/2018 CLINICAL DATA:  68 year old male with newly diagnosed prostate cancer. EXAM: NUCLEAR MEDICINE WHOLE BODY BONE SCAN TECHNIQUE: Whole body anterior and posterior images were obtained approximately 3 hours after intravenous injection of radiopharmaceutical. RADIOPHARMACEUTICALS:  21.3 mCi Technetium-89m MDP IV COMPARISON:  CT Abdomen and Pelvis today. FINDINGS: Expected radiotracer activity in both kidneys and the urinary bladder. Homogeneous appearing radiotracer activity in the thoracic and lumbar spine. Asymmetric activity in the lower cervical spine most resembles degenerative change. Subtle asymmetric nodular foci of posterior rib activity maximal on the right at approximately the 10th rib level. Asymmetric degenerative appearing radiotracer activity at the shoulders, right wrist, left hand, right knee, ankles, and feet.  No other suspicious radiotracer activity identified. IMPRESSION: 1. Heterogeneous posterior rib activity most pronounced on the right at approximately the 10th rib level. This is indeterminate. Correlation with Chest CT (noncontrast should suffice) may be valuable. 2. No other suspicious radiotracer activity identified. Multifocal degenerative changes. Electronically Signed   By: Genevie Ann M.D.   On: 08/23/2018 21:43      IMPRESSION/PLAN: 68 y.o. gentleman with Stage T1c adenocarcinoma of the prostate with a PSA of 84.2 and a Gleason score of 4+5.    We discussed the patient's workup and outlined the nature of prostate cancer in this setting. The patient's T stage, Gleason's score, and PSA put him into the high risk group. Accordingly, he is eligible for a variety of potential treatment options including long-term androgen deprivation therapy in combination with either 8 weeks of external radiation or brachytherapy followed by 5 weeks of external radiation. We discussed the available radiation techniques, and focused on the details and logistics and delivery. We discussed and outlined the risks, benefits, short and long-term effects associated with radiotherapy and compared and contrasted these with prostatectomy. We discussed the role of SpaceOAR in reducing the rectal toxicity associated with radiotherapy. We also detailed the role of ADT in the treatment of high-risk prostate cancer and outlined the associated side effects that could be expected with this therapy.  At the end of the conversation the patient appears to be leaning towards LT-ADT in combination with radiation but is undecided regarding his final treatment preference in regards to 8 weeks IMRT versus seed boost and 5 weeks IMRT. We will follow up with him and his daughters on his final decision in the near future. He was encouraged to call with any additional questions.  We also discussed that due to concerns for limiting patient exposure with  the current COVID-19 pandemic, any radiation planning and/or procedures may be delayed. The patient voiced understanding and is comfortable with this plan.  This encounter was provided by telemedicine platform Webex.  The patient has given verbal consent for this type of encounter and has been advised to only accept a meeting of this type in a secure network environment. The time spent during this encounter was 45 minutes and greater than 50% of that time was spent face to face. The attendants for this meeting include Tyler Pita MD, Freeman Caldron PA-C, scribe Clinton Sawyer, and patient Joseph Gentry and his daughter.  During the encounter, Tyler Pita MD, Ashlyn Bruning PA-C, and scribe Clinton Sawyer were located at [facility] Radiation Oncology Department.  Patient Joseph Gentry and daughter were located at home.    Nicholos Johns, PA-C    Tyler Pita, MD  [facility] Direct Dial: [phone]   Fax: [phone] [email or web address]  Skype   [email or web address]  This document serves as a record of services personally performed by Tyler Pita, MD and Freeman Caldron, PA-C. It was created on their behalf by Rae Lips, a trained medical scribe. The creation of this record is based on the scribe's personal observations and the providers' statements to them. This document has been checked and approved by the attending providers.

## 2018-09-07 NOTE — Consult Note (Signed)
Telehealth Visit     09/07/2018   --------------------------------------------------------------------------------   Joseph Gentry  MRN: [id number]  PRIMARY CARE:  Tesfaye D. Legrand Rams, MD  DOB: Nov 26, 1950, 68 year old Male  REFERRING:  Joseph Gentry. Dahlstedt, MD  SSN:   PROVIDER:  Franchot Gallo, M.D.    TREATING:  Raynelle Bring, M.D.    LOCATION:  [facility] [phone]   --------------------------------------------------------------------------------   CC/HPI: CC: Prostate Cancer   Physician requesting consult: Dr. Franchot Gallo  PCP: Dr. Rosita Fire  Location of consultation: Telehealth visit   Mr. Joseph Gentry is a 68 year old gentleman who was found to have an elevated PSA of 79.4 that remained elevated at 84.2 when rechecked. He is healthy with a PMH significant for GERD, asthma, and PUD. He underwent a TRUS biopsy of the prostate on 08/04/18 demonstrating Gleason 4+4=8 adenocarcinoma with 10 out of 12 biopsy cores positive for malignancy.   Family history: None.   Imaging studies:  CT abdomen and pelvis (August 29, 2018): No evidence of lymphadenopathy or metastatic disease.  Bone scan: (Aug 29, 2018): Enhancement of the posterior 10th rib  CT chest: His rib lesion appeared to be most consistent with posttraumatic changes rather than metastatic disease. This was also consistent with his clinical history of a prior rib fracture.   PMH: He has a history of GERD, asthma, and PUD. He also has a history of alcohol abuse and tobacco use.  PSH: No abdominal surgeries.   TNM stage: cT1c N0 M0-1  PSA: 84.2  Gleason score: 4+5=9  Biopsy (08/04/18): 10/12 cores positive  Left: L lateral apex (90%, 3+4=7), L apex (90%, 4+3=7), L lateral mid (90%, 4+3=7), L lateral base (10%, 4+3=7), L base (10%, 4+3=7)  Right: R apex (90%, 4+5=9), R lateral apex (90%, 4+3=7), R mid (20%, 4+3=7), R lateral mid (50%, 4+4=8), R lateral base (40%, 4+3=7)  Prostate volume: 22.2 cc   Nomogram  OC  disease: 1%  EPE: 99%  SVI: 94%  LNI: 92%  PFS (5 year, 10 year): 9%, 5%   Urinary function: IPSS is .  Erectile function: SHIM score is .     ALLERGIES: No Allergies    MEDICATIONS: Levaquin 750 mg tablet 1 tablet PO Morning of biopsy  Prilosec  Advil  Albuterol Sulfate  Diazepam 10 mg tablet 1 tablet PO 2 hours before procedure  Diazepam 10 mg tablet 1 tablet PO 2 hours before biopsy     GU PSH: Locm 300-399Mg /Ml Iodine,1Ml - 08-29-18 Prostate Needle Biopsy - 08/04/2018    NON-GU PSH: Surgical Pathology, Gross And Microscopic Examination For Prostate Needle - 08/04/2018    GU PMH: Elevated PSA - 08/04/2018, PSA 79.4 a couple of months ago. He has a mildly irregular gland but certainly not suspicious on palpation. Urinalysis today is clear. I am suspicious of prostate cancer, but we will repeat PSA, - 05/18/2018 Urinary Urgency, Significant lower urinary tract symptomatology, probably made worse by his alcohol intake. - 05/18/2018 Prostate Cancer      PMH Notes: stomach ulcer   NON-GU PMH: GERD    FAMILY HISTORY: Death In The Family Father - Father Kidney Stones - Brother   SOCIAL HISTORY: Marital Status: Single Preferred Language: English; Race: Black or African American Current Smoking Status: Patient smokes. Has smoked since 05/16/1978. Smokes 1/2 pack per day.   Tobacco Use Assessment Completed: Used Tobacco in last 30 days? Drinks 6 drinks per day. Types of alcohol consumed: Beer.  Drinks 1 caffeinated drink per day.  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     Notes: 2 daughters   REVIEW OF SYSTEMS:    GU Review Male:   Patient denies get up at night to urinate, burning/ pain with urination, hard to postpone urination, stream starts and stops, leakage of urine, trouble starting your streams, frequent urination, and have to strain to urinate .  Gastrointestinal (Lower):   Patient denies diarrhea and constipation.  Gastrointestinal (Upper):   Patient  denies nausea and vomiting.  Constitutional:   Patient denies fever, night sweats, weight loss, and fatigue.  Skin:   Patient denies skin rash/ lesion and itching.  Eyes:   Patient denies blurred vision and double vision.  Ears/ Nose/ Throat:   Patient denies sore throat and sinus problems.  Hematologic/Lymphatic:   Patient denies swollen glands and easy bruising.  Cardiovascular:   Patient denies leg swelling and chest pains.  Respiratory:   Patient denies cough and shortness of breath.  Endocrine:   Patient denies excessive thirst.  Musculoskeletal:   Patient denies back pain and joint pain.  Neurological:   Patient denies headaches and dizziness.  Psychologic:   Patient denies depression and anxiety.   PAST DATA REVIEWED:  Source Of History:  Patient  Records Review:   Pathology Reports, Previous Patient Records  X-Ray Review: C.T. Chest/ Abd/Pelvis: Reviewed Films.  Bone Scan: Reviewed Films.     05/18/18 03/25/18  PSA  Total PSA 84.2 ng/dl 79.4 ng/dl   Notes:                     We reviewed his pathology, CT scans, and bone scan via video conference today with the multidisciplinary group.   PROCEDURES:          Teleheatlh This patient encounter is appropriate and reasonable under the circumstances given the patient's particular presentation at this time. The patient has been advised of the potential risks and limitations of this mode of treatment (including, but not limited to, the absence of in-person examination) and has agreed to be treated in a remote fashion in spite of them.   Any and all of the patient's/patient's family's questions on this issue have been answered, and I have made no promises or guarantees to the patient. The patient has also been advised to contact this office for worsening conditions or problems, and seek emergency medical treatment and/or call 911 if the patient deems either necessary.    ASSESSMENT:      ICD-10 Details  1 GU:   Prostate Cancer -  C61      PLAN:           Document Letter(s):  Created for Patient: Clinical Summary         Notes:   1. Very high risk prostate cancer: I had a thorough discussion with Mr. Joseph Gentry and his family today. We discussed the extremely high risk that he has for harboring micrometastatic disease and a reasonable discussion about the chance for cure. However, he does not have evidence of obvious metastatic disease and we did offer him the option of proceeding with curative therapy. The patient was counseled about the natural history of prostate cancer and the standard treatment options that are available for prostate cancer. It was explained to him how his age and life expectancy, clinical stage, Gleason score, and PSA affect his prognosis, the decision to proceed with additional staging studies, as well as how that information influences recommended treatment strategies. We discussed the roles for active  surveillance, radiation therapy, surgical therapy, androgen deprivation, as well as ablative therapy options for the treatment of prostate cancer as appropriate to his individual cancer situation. We discussed the risks and benefits of these options with regard to their impact on cancer control and also in terms of potential adverse events, complications, and impact on quality of life particularly related to urinary and sexual function. The patient was encouraged to ask questions throughout the discussion today and all questions were answered to his stated satisfaction. In addition, the patient was provided with and/or directed to appropriate resources and literature for further education about prostate cancer and treatment options.   He understands that he would have a low chance for cure with surgery alone although we did discuss surgery in the context of the PROTEUS trial. In addition, we discussed the option of proceeding with radiation therapy and long-term androgen deprivation therapy. We reviewed the pros  and cons of each approach. Currently, he appears to be leaning toward androgen deprivation therapy and radiation therapy for treatment. He is scheduled to discuss his options further with Dr. Tammi Klippel this afternoon. All questions were answered to his and his family stated satisfaction.   CC: Dr. Franchot Gallo  Dr. Rosita Fire  Dr. Zola Button  Dr. Tyler Pita

## 2018-09-08 ENCOUNTER — Telehealth: Payer: Self-pay | Admitting: Oncology

## 2018-09-08 ENCOUNTER — Encounter: Payer: Self-pay | Admitting: General Practice

## 2018-09-08 NOTE — Progress Notes (Signed)
[place] [facility] Note  Attempted to reach [name] by phone as [place] follow-up to [facility], but was unable to get through. Will mail packet of Support Team information.   [place], [place], [facility] Pager [phone] Voicemail [phone]

## 2018-09-08 NOTE — Telephone Encounter (Signed)
No 3/24 los

## 2018-09-10 ENCOUNTER — Telehealth: Payer: Self-pay | Admitting: *Deleted

## 2018-09-10 NOTE — Telephone Encounter (Signed)
CALLED PATIENT TO INFORM OF ADT APPT. WITH DR. DAHLSTEDT ON 09-13-18 @ 12:30 PM, LVM FOR A RETURN CALL

## 2018-09-14 ENCOUNTER — Telehealth: Payer: Self-pay | Admitting: *Deleted

## 2018-09-14 NOTE — Telephone Encounter (Signed)
CALLED PATIENT'S DAUGHTER- ANGELA TO ASK ABOUT GETTING UP WITH HER DAD TO ARRANGE A HORMONE INJ @ [facility], LVM FOR A RETURN CALL

## 2018-09-16 ENCOUNTER — Telehealth: Payer: Self-pay | Admitting: Medical Oncology

## 2018-09-16 NOTE — Telephone Encounter (Signed)
Left voicemail message with daughter Zigmund Gottron as follow up to [facility]. I informed her that Enid Derry has attempted to reach her to schedule her father's ADT. I asked for a return call to discuss clinic follow up and if she would reach out to [place] to discuss ADT.

## 2018-09-16 NOTE — Telephone Encounter (Signed)
[name]-daughter called stating her father has not made a decision to move forward with ADT or radiation. She states as a family they have done more research on the ADT and have been discussing with her father. We discussed his high risk cancer and how important ADT is in controlling his cancer. She voiced  understanding and asked that we give him a couple more weeks to make his final decision. She plans to call me with his decision and asked if I have not heard from her in two weeks to please call her.

## 2018-10-01 ENCOUNTER — Telehealth: Payer: Self-pay | Admitting: Medical Oncology

## 2018-10-01 NOTE — Telephone Encounter (Signed)
Left message with Zigmund Gottron (daughter) requesting a return call to discuss her father's treatment decision. I informed her I am working remotely and asked her to leave his decision on my office voicemail.

## 2018-10-14 ENCOUNTER — Telehealth: Payer: Self-pay | Admitting: Medical Oncology

## 2018-10-14 NOTE — Telephone Encounter (Signed)
Spoke with [name]-daughter to follow up on treatment decision. She states they are still having conversations about treatment but her father has not made his final decision. She is aware I am working remotely and I asked her to leave his decision on my office phone. If I do not hear back in 2 weeks, I will follow up with her. She voiced understanding.

## 2018-10-28 ENCOUNTER — Telehealth: Payer: Self-pay | Admitting: Medical Oncology

## 2018-11-09 NOTE — Telephone Encounter (Signed)
Spoke with [name]-daughter regarding her father's treatment decision regarding his prostate cancer. She states he has made any decisions at this time. She states she will call and leave me a message when he decides. She thanked me for following up.

## 2018-11-17 DIAGNOSIS — K649 Unspecified hemorrhoids: Secondary | ICD-10-CM | POA: Diagnosis not present

## 2018-11-17 DIAGNOSIS — R197 Diarrhea, unspecified: Secondary | ICD-10-CM | POA: Diagnosis not present

## 2018-11-17 DIAGNOSIS — E860 Dehydration: Secondary | ICD-10-CM | POA: Diagnosis not present

## 2018-11-25 ENCOUNTER — Telehealth: Payer: Self-pay | Admitting: Medical Oncology

## 2018-11-25 NOTE — Telephone Encounter (Addendum)
Tena-daughter called stating her father has decided to start Lupron but hold off on radiation. He is living in [place] but is willing to come to [facility] for the injections. If he decides to move forward with radiation, she will call.  I informed her Dr. Alan Ripper office will call with appointment. She voiced understanding. Ashlyn,PA notified.

## 2018-12-01 DIAGNOSIS — L290 Pruritus ani: Secondary | ICD-10-CM | POA: Diagnosis not present

## 2018-12-01 DIAGNOSIS — Z1211 Encounter for screening for malignant neoplasm of colon: Secondary | ICD-10-CM | POA: Diagnosis not present

## 2018-12-02 ENCOUNTER — Encounter: Payer: Self-pay | Admitting: Gastroenterology

## 2018-12-03 ENCOUNTER — Telehealth: Payer: Self-pay | Admitting: Medical Oncology

## 2018-12-03 NOTE — Telephone Encounter (Signed)
Tena-daughter, left a voicemail stating her father has not been contacted with appointment for Lupron. She is also interested in hearing more about brachytherapy. I returned her call, left a message that I will call her later today to discuss brachytherapy and I will follow up on appointment for Lupron.

## 2018-12-06 ENCOUNTER — Telehealth: Payer: Self-pay | Admitting: Medical Oncology

## 2018-12-06 NOTE — Telephone Encounter (Signed)
Left voicemail Lupron injection 7/1 12:15 pm at [facility]. I asked her to call me to confirm.

## 2018-12-10 ENCOUNTER — Telehealth: Payer: Self-pay | Admitting: Medical Oncology

## 2018-12-10 NOTE — Telephone Encounter (Signed)
Left message with Tena-daughter requesting a return call to confirm appointment for Lupron 7/1 @12 :15 pm at [facility]. I have left several messages without a return call to confirm.

## 2018-12-13 ENCOUNTER — Telehealth: Payer: Self-pay | Admitting: Medical Oncology

## 2018-12-13 NOTE — Telephone Encounter (Signed)
[name]-daughter, called and left a message stating she is not sure if her father needs the Lupron injection or to move forward with seed implant. I called her back, left a message explaining  what Lupron does and why he needs to get it  sooner than later.  He is scheduled for Lupron 7/1 @12 :15 with [facility]. I asked her to call me back to discuss further or I will ask Ashlyn,PA  to reach out to her.

## 2018-12-14 ENCOUNTER — Telehealth: Payer: Self-pay | Admitting: Medical Oncology

## 2018-12-14 NOTE — Telephone Encounter (Signed)
Tena-daughter left a voice message on my office phone  that her father will not be able to make appointment for Lupron tomorrow. She states they would like to discuss seed implant and do that  before hormone treatment. I have left her several messages explaining the importance of Lupron and he does not need to delay treatment. We have not been able to connect to discuss. I left my personal cell number and asked her to call me without a return call.  I will notify [facility] to cancel appointment tomorrow and ask Ashlyn, PA with Dr. Tammi Klippel to reach out to her.

## 2018-12-15 ENCOUNTER — Telehealth: Payer: Self-pay | Admitting: Urology

## 2018-12-15 NOTE — Telephone Encounter (Signed)
I left a voicemail for the patient's daughter Joseph Gentry, requesting that she return my call so that I can answer any further questions and clarify any concerns that she still has regarding her father's prostate cancer treatment and the recommendations that were made at the time of multidisciplinary prostate conference in March 2020. They have been in communication with Joseph Rue, RN who has been working hard to try and coordinate his treatment but they apparently are struggling to make a decision regarding when and if to proceed with treatment.  I left her a message requesting that she return my call so I hope to be in communication with her in the very near future so that at minimum, we can get him back into see Dr. Diona Fanti to start his ADT treatment while they continue to make a decision regarding seed implant followed by 5 weeks of daily radiation versus 8 weeks of daily radiation.  Nicholos Johns, MMS, PA-C [place] at [place]: [phone]  Fax: [phone]

## 2018-12-15 NOTE — Telephone Encounter (Signed)
-----   Message from Gwenette Greet, RN sent at 12/14/2018  4:41 PM EDT ----- Regarding: HELP Hi Agam Tuohy,  I am not sure if you remember Mr. Larue, but we did a WebEx with him and his daughter Carolynn Serve. He is living with her in [place]. They have delayed treatment and I finally had him scheduled for Lupron tomorrow and now she is cancelling. I have left her multiple messages, trying to connect with her. She never returns my calls but suggests I do not return calls.I have documented my calls and even left her my cell number. I am so frustrated with her. She has a bit of an attitude and Dr. Alinda Money mentioned that in [facility],  [place], she left me a message on my office phone, stating they would like seed implant and then maybe hormone therapy. I have tried to explain to her, that ADT is the main treatment and he needs to stop delaying. She went on to talk about a colonoscopy  appt she got scheduled for him.??   She asked if the doctor that does seeds could call her. Would you be willing to call her and try to help her understand the treatment he needs? I am about to scream. Tena- [phone]  Thanks,  Shirlean Mylar

## 2018-12-16 ENCOUNTER — Telehealth: Payer: Self-pay | Admitting: Medical Oncology

## 2018-12-22 DIAGNOSIS — K635 Polyp of colon: Secondary | ICD-10-CM | POA: Diagnosis not present

## 2018-12-22 DIAGNOSIS — Z1211 Encounter for screening for malignant neoplasm of colon: Secondary | ICD-10-CM | POA: Diagnosis not present

## 2018-12-22 DIAGNOSIS — D124 Benign neoplasm of descending colon: Secondary | ICD-10-CM | POA: Diagnosis not present

## 2018-12-22 DIAGNOSIS — K643 Fourth degree hemorrhoids: Secondary | ICD-10-CM | POA: Diagnosis not present

## 2018-12-22 DIAGNOSIS — F17200 Nicotine dependence, unspecified, uncomplicated: Secondary | ICD-10-CM | POA: Diagnosis not present

## 2018-12-30 ENCOUNTER — Encounter: Payer: Self-pay | Admitting: Medical Oncology

## 2018-12-30 NOTE — Telephone Encounter (Signed)
Left message for return call to discuss father's treatment plan.

## 2019-01-04 ENCOUNTER — Telehealth: Payer: Self-pay | Admitting: *Deleted

## 2019-01-04 ENCOUNTER — Ambulatory Visit: Payer: Medicare PPO | Admitting: Urology

## 2019-01-04 ENCOUNTER — Encounter: Payer: Self-pay | Admitting: Urology

## 2019-01-04 NOTE — Progress Notes (Signed)
Patient will have firmagon on 01-11-19 with Dr. Diona Fanti in [place] office and be transitioned to Lupron in a month.  I will continue to try and connect with his daughter, Zigmund Gottron, to further discuss radiation treatment options as they are still deciding between seed boost with 5 weeks EBRT vs 8 weeks IMRT.  Nicholos Johns, MMS, PA-C [place] at [place]: [phone]  Fax: [phone]

## 2019-01-04 NOTE — Telephone Encounter (Signed)
CALLED PATIENT'S DAUGHTER- [name] TO INFORM OF APPT. FOR FIRMAGON INJ. ON 01/11/19 @ 1:30 PM @ DR. DAHLSTEDT'S OFFICE IN [place], LVM FOR A RETURN CALL

## 2019-01-05 DIAGNOSIS — R1013 Epigastric pain: Secondary | ICD-10-CM | POA: Diagnosis not present

## 2019-01-11 ENCOUNTER — Telehealth: Payer: Self-pay | Admitting: Radiation Oncology

## 2019-01-11 ENCOUNTER — Other Ambulatory Visit (INDEPENDENT_AMBULATORY_CARE_PROVIDER_SITE_OTHER): Payer: Medicare PPO | Admitting: Urology

## 2019-01-11 DIAGNOSIS — C61 Malignant neoplasm of prostate: Secondary | ICD-10-CM | POA: Diagnosis not present

## 2019-01-11 DIAGNOSIS — E785 Hyperlipidemia, unspecified: Secondary | ICD-10-CM | POA: Diagnosis not present

## 2019-01-11 DIAGNOSIS — Z1389 Encounter for screening for other disorder: Secondary | ICD-10-CM | POA: Diagnosis not present

## 2019-01-11 DIAGNOSIS — R972 Elevated prostate specific antigen [PSA]: Secondary | ICD-10-CM | POA: Diagnosis not present

## 2019-01-11 DIAGNOSIS — Z1331 Encounter for screening for depression: Secondary | ICD-10-CM | POA: Diagnosis not present

## 2019-01-11 DIAGNOSIS — Z0001 Encounter for general adult medical examination with abnormal findings: Secondary | ICD-10-CM | POA: Diagnosis not present

## 2019-01-11 DIAGNOSIS — K6289 Other specified diseases of anus and rectum: Secondary | ICD-10-CM | POA: Diagnosis not present

## 2019-01-11 DIAGNOSIS — E43 Unspecified severe protein-calorie malnutrition: Secondary | ICD-10-CM | POA: Diagnosis not present

## 2019-01-11 NOTE — Telephone Encounter (Signed)
Patient's daughter, Otila Kluver, was misdirected to my answering machine. She is attempting to reach [facility] about scheduling. This RN informed Romie Jumper of this finding.

## 2019-01-14 ENCOUNTER — Telehealth: Payer: Self-pay | Admitting: Urology

## 2019-01-14 NOTE — Telephone Encounter (Signed)
I left another message on Tena's VM requesting that she return my call to further discuss treatment options for her dad's prostate cancer and answer any further questions she has so that we can move forward with treatment.  Nicholos Johns, MMS, PA-C [place] at [place]: [phone]  Fax: [phone]

## 2019-01-17 ENCOUNTER — Telehealth: Payer: Self-pay | Admitting: Radiation Oncology

## 2019-01-17 NOTE — Telephone Encounter (Signed)
Received voicemail message from [phone]. Phoned to inquire. Spoke with patient's daughter, Otila Kluver. Otila Kluver is trying to reach [employer], PA-C. Explained she is off on Monday and offered to assist. She questions when radiation can be scheduled. She reports her father received a shot on 01/11/2019 [place]?) and has another scheduled for the third week of August (Lupron ?). She reports her father is staying with her and her two children in [place]. She questions if it is safe for him to be around her children while receiving radiation therapy. She questions if someone in [place] (closer) could give her father these treatments or if she needs to make arrangements with her sister to move him into her house. She questions what side effects her father will have. She questions the amount of radiation he will receive with seeds versus "the other kind of radiation." She questions the safety of these treatments with his age. She understands this RN will inform Ashlyn, PA-C of these question. Explained that [place] will attempt on Tuesday to return her call and address these questions. Otila Kluver verbalized understanding.

## 2019-01-20 DIAGNOSIS — C61 Malignant neoplasm of prostate: Secondary | ICD-10-CM | POA: Diagnosis not present

## 2019-01-20 DIAGNOSIS — F17200 Nicotine dependence, unspecified, uncomplicated: Secondary | ICD-10-CM | POA: Diagnosis not present

## 2019-01-20 DIAGNOSIS — F339 Major depressive disorder, recurrent, unspecified: Secondary | ICD-10-CM | POA: Diagnosis not present

## 2019-01-20 DIAGNOSIS — E43 Unspecified severe protein-calorie malnutrition: Secondary | ICD-10-CM | POA: Diagnosis not present

## 2019-01-21 ENCOUNTER — Encounter: Payer: Self-pay | Admitting: Medical Oncology

## 2019-01-21 NOTE — Progress Notes (Signed)
Spoke with Tena-daughter, to discuss getting hormone injection scheduled with Dr. Diona Fanti. She was unable to bring him to the last scheduled appointment. Tena questioned why her dad needs the hormone injection because  his friends who have prostate cancer, did not get hormone injections.   I discussed his PSA, his pathology report and why he needs the injection. She voiced understanding and states she thought it was experimental. She asked why he needed to return to urology after his referred to the [facility]. I discussed the role of urology and the other members of the [facility] team. I stressed that Dr. Diona Fanti will continue to follow him after radiation to monitor his PSA's and continue hormone therapy. I stressed the importance of getting the hormone injection as soon as possible.  I encouraged her to have a follow up consult with Dr. Tammi Klippel to re-discuss radiation. I informed her that Ashlyn, attempted to reach her without success but I will ask her to reach out again. She asked if I will get the hormone injection rescheduled with Dr. Diona Fanti at the [place] office on July 28 or later. I will be happy to get his scheduled and she will be notified of appointment.

## 2019-01-26 DIAGNOSIS — F17200 Nicotine dependence, unspecified, uncomplicated: Secondary | ICD-10-CM | POA: Diagnosis not present

## 2019-01-26 DIAGNOSIS — K2970 Gastritis, unspecified, without bleeding: Secondary | ICD-10-CM | POA: Diagnosis not present

## 2019-01-26 DIAGNOSIS — K3189 Other diseases of stomach and duodenum: Secondary | ICD-10-CM | POA: Diagnosis not present

## 2019-01-26 DIAGNOSIS — R1013 Epigastric pain: Secondary | ICD-10-CM | POA: Diagnosis not present

## 2019-01-28 ENCOUNTER — Telehealth: Payer: Self-pay | Admitting: Urology

## 2019-01-28 DIAGNOSIS — R1013 Epigastric pain: Secondary | ICD-10-CM | POA: Diagnosis not present

## 2019-01-28 NOTE — Telephone Encounter (Signed)
I had a lengthy conversation with the patient's daughter, Joseph Gentry, to further discuss and clarify treatment recommendations for her father.  I reiterated the treatment options and why seed implant alone is not an effective/curative treatment option for her father given his high risk, high volume disease.  We reviewed the details and logistics of our recommendation to proceed with either: prostatectomy or  LT-ADT in combination with either 8 weeks of prostate IMRT or seed boost up front followed by 5 weeks of prostate IMRT. Her father started ADT 01/11/19 so I explained the need to delay radiation treatment start until approximately 8 weeks thereafter (week of Sept. 21st) to allow the hormone therapy time to sensitize the cancer cells prior to starting daily XRT or seed boost.   She is going to discuss this further with her father and reports that if he elects to do daily radiation treatments, either with seed boost or without, they would want to have those treatments in the [place] area since this is where he is currently staying and will remain, with his daughters throughout treatment. I advised that I would confirm ADT start date and follow up with her to lay out a detailed treatment timeline as well as provide a referral to radiation oncology colleague in [place] to consult regarding possible daily XRT closer to home.  Nicholos Johns, MMS, PA-C [place] at [place]: [phone]  Fax: [phone]

## 2019-01-31 ENCOUNTER — Other Ambulatory Visit: Payer: Self-pay | Admitting: Medical Oncology

## 2019-01-31 ENCOUNTER — Encounter: Payer: Self-pay | Admitting: Medical Oncology

## 2019-01-31 ENCOUNTER — Telehealth: Payer: Self-pay | Admitting: Medical Oncology

## 2019-01-31 DIAGNOSIS — C61 Malignant neoplasm of prostate: Secondary | ICD-10-CM

## 2019-01-31 NOTE — Telephone Encounter (Signed)
Patient has an appointment with Dr. Diona Fanti 9/1 @11 :15 am for an  office visit and  Lupron. I called Tena-daughter with this information. She voiced understanding and states that her father would like to do 8 weeks of radiation. He is currently living with his daughter in the [place] area and would like to receive treatment there. Per Dr. Tammi Klippel, he will be happy to refer him to [facility] in [place]. She asked, where will her receive care once he has completed radiation? I explained, he will return to Dr. Dahlstedt-urologist for hormone injections and labs. He will be returning  home to  [place] after completion of radiation.

## 2019-02-07 ENCOUNTER — Other Ambulatory Visit: Payer: Self-pay | Admitting: Medical Oncology

## 2019-02-07 ENCOUNTER — Encounter: Payer: Self-pay | Admitting: Medical Oncology

## 2019-02-07 NOTE — Progress Notes (Signed)
[facility] regarding referral. They are unable to see referral and asked that I fax to [phone].

## 2019-02-08 ENCOUNTER — Encounter: Payer: Self-pay | Admitting: Medical Oncology

## 2019-02-08 NOTE — Progress Notes (Signed)
Faxed referral to [facility] [place]. They will contact daughter [name],with appointment.

## 2019-02-09 ENCOUNTER — Telehealth: Payer: Self-pay | Admitting: Medical Oncology

## 2019-02-10 ENCOUNTER — Telehealth: Payer: Self-pay | Admitting: Medical Oncology

## 2019-02-10 NOTE — Telephone Encounter (Signed)
[facility] called with consult 02/18/19 @ am via virtual visit.

## 2019-02-10 NOTE — Progress Notes (Signed)
I left message with [name] to inform her referral was sent to [facility] and her father has been scheduled for 02/15/19. Clovis Riley will contact her with information about appointment.

## 2019-02-10 NOTE — Telephone Encounter (Signed)
Spoke with [name]-daughter to confirm appointment with [facility] for 9/4 @ 9:00 am via virtual visit and appointment with Dr. Diona Fanti 9/1 @ 11:15 am for Lupron. She confirmed the above. She asked will he follow up here with Dr. Tammi Klippel after he completes radiation. I explained that once he completes radiation, he will be referred back to Dr. Diona Fanti to continue his care. I asked her to call me with any questions or concerns and she voiced understanding.

## 2019-02-15 ENCOUNTER — Ambulatory Visit (INDEPENDENT_AMBULATORY_CARE_PROVIDER_SITE_OTHER): Payer: Medicare PPO | Admitting: Urology

## 2019-02-15 DIAGNOSIS — C61 Malignant neoplasm of prostate: Secondary | ICD-10-CM

## 2019-02-18 DIAGNOSIS — F17210 Nicotine dependence, cigarettes, uncomplicated: Secondary | ICD-10-CM | POA: Diagnosis not present

## 2019-02-18 DIAGNOSIS — C61 Malignant neoplasm of prostate: Secondary | ICD-10-CM | POA: Diagnosis not present

## 2019-02-18 DIAGNOSIS — Z20828 Contact with and (suspected) exposure to other viral communicable diseases: Secondary | ICD-10-CM | POA: Diagnosis not present

## 2019-02-18 DIAGNOSIS — Z191 Hormone sensitive malignancy status: Secondary | ICD-10-CM | POA: Diagnosis not present

## 2019-02-18 DIAGNOSIS — Z510 Encounter for antineoplastic radiation therapy: Secondary | ICD-10-CM | POA: Diagnosis not present

## 2019-02-23 DIAGNOSIS — C61 Malignant neoplasm of prostate: Secondary | ICD-10-CM | POA: Diagnosis not present

## 2019-02-23 DIAGNOSIS — Z20828 Contact with and (suspected) exposure to other viral communicable diseases: Secondary | ICD-10-CM | POA: Diagnosis not present

## 2019-02-23 DIAGNOSIS — Z191 Hormone sensitive malignancy status: Secondary | ICD-10-CM | POA: Diagnosis not present

## 2019-02-23 DIAGNOSIS — F17210 Nicotine dependence, cigarettes, uncomplicated: Secondary | ICD-10-CM | POA: Diagnosis not present

## 2019-02-23 DIAGNOSIS — Z510 Encounter for antineoplastic radiation therapy: Secondary | ICD-10-CM | POA: Diagnosis not present

## 2019-02-28 DIAGNOSIS — Z20828 Contact with and (suspected) exposure to other viral communicable diseases: Secondary | ICD-10-CM | POA: Diagnosis not present

## 2019-02-28 DIAGNOSIS — Z191 Hormone sensitive malignancy status: Secondary | ICD-10-CM | POA: Diagnosis not present

## 2019-02-28 DIAGNOSIS — C61 Malignant neoplasm of prostate: Secondary | ICD-10-CM | POA: Diagnosis not present

## 2019-02-28 DIAGNOSIS — Z510 Encounter for antineoplastic radiation therapy: Secondary | ICD-10-CM | POA: Diagnosis not present

## 2019-02-28 DIAGNOSIS — F17210 Nicotine dependence, cigarettes, uncomplicated: Secondary | ICD-10-CM | POA: Diagnosis not present

## 2019-03-02 DIAGNOSIS — F17210 Nicotine dependence, cigarettes, uncomplicated: Secondary | ICD-10-CM | POA: Diagnosis not present

## 2019-03-02 DIAGNOSIS — Z191 Hormone sensitive malignancy status: Secondary | ICD-10-CM | POA: Diagnosis not present

## 2019-03-02 DIAGNOSIS — Z510 Encounter for antineoplastic radiation therapy: Secondary | ICD-10-CM | POA: Diagnosis not present

## 2019-03-02 DIAGNOSIS — Z20828 Contact with and (suspected) exposure to other viral communicable diseases: Secondary | ICD-10-CM | POA: Diagnosis not present

## 2019-03-02 DIAGNOSIS — C61 Malignant neoplasm of prostate: Secondary | ICD-10-CM | POA: Diagnosis not present

## 2019-03-03 DIAGNOSIS — Z191 Hormone sensitive malignancy status: Secondary | ICD-10-CM | POA: Diagnosis not present

## 2019-03-03 DIAGNOSIS — F17210 Nicotine dependence, cigarettes, uncomplicated: Secondary | ICD-10-CM | POA: Diagnosis not present

## 2019-03-03 DIAGNOSIS — C61 Malignant neoplasm of prostate: Secondary | ICD-10-CM | POA: Diagnosis not present

## 2019-03-03 DIAGNOSIS — Z20828 Contact with and (suspected) exposure to other viral communicable diseases: Secondary | ICD-10-CM | POA: Diagnosis not present

## 2019-03-03 DIAGNOSIS — Z510 Encounter for antineoplastic radiation therapy: Secondary | ICD-10-CM | POA: Diagnosis not present

## 2019-03-04 DIAGNOSIS — F17210 Nicotine dependence, cigarettes, uncomplicated: Secondary | ICD-10-CM | POA: Diagnosis not present

## 2019-03-04 DIAGNOSIS — Z510 Encounter for antineoplastic radiation therapy: Secondary | ICD-10-CM | POA: Diagnosis not present

## 2019-03-04 DIAGNOSIS — Z191 Hormone sensitive malignancy status: Secondary | ICD-10-CM | POA: Diagnosis not present

## 2019-03-04 DIAGNOSIS — C61 Malignant neoplasm of prostate: Secondary | ICD-10-CM | POA: Diagnosis not present

## 2019-03-04 DIAGNOSIS — Z20828 Contact with and (suspected) exposure to other viral communicable diseases: Secondary | ICD-10-CM | POA: Diagnosis not present

## 2019-03-07 DIAGNOSIS — Z510 Encounter for antineoplastic radiation therapy: Secondary | ICD-10-CM | POA: Diagnosis not present

## 2019-03-07 DIAGNOSIS — C61 Malignant neoplasm of prostate: Secondary | ICD-10-CM | POA: Diagnosis not present

## 2019-03-07 DIAGNOSIS — F17210 Nicotine dependence, cigarettes, uncomplicated: Secondary | ICD-10-CM | POA: Diagnosis not present

## 2019-03-07 DIAGNOSIS — Z191 Hormone sensitive malignancy status: Secondary | ICD-10-CM | POA: Diagnosis not present

## 2019-03-07 DIAGNOSIS — Z20828 Contact with and (suspected) exposure to other viral communicable diseases: Secondary | ICD-10-CM | POA: Diagnosis not present

## 2019-03-08 DIAGNOSIS — F17210 Nicotine dependence, cigarettes, uncomplicated: Secondary | ICD-10-CM | POA: Diagnosis not present

## 2019-03-08 DIAGNOSIS — C61 Malignant neoplasm of prostate: Secondary | ICD-10-CM | POA: Diagnosis not present

## 2019-03-08 DIAGNOSIS — Z191 Hormone sensitive malignancy status: Secondary | ICD-10-CM | POA: Diagnosis not present

## 2019-03-08 DIAGNOSIS — Z510 Encounter for antineoplastic radiation therapy: Secondary | ICD-10-CM | POA: Diagnosis not present

## 2019-03-08 DIAGNOSIS — Z20828 Contact with and (suspected) exposure to other viral communicable diseases: Secondary | ICD-10-CM | POA: Diagnosis not present

## 2019-03-09 DIAGNOSIS — Z510 Encounter for antineoplastic radiation therapy: Secondary | ICD-10-CM | POA: Diagnosis not present

## 2019-03-09 DIAGNOSIS — F17210 Nicotine dependence, cigarettes, uncomplicated: Secondary | ICD-10-CM | POA: Diagnosis not present

## 2019-03-09 DIAGNOSIS — Z20828 Contact with and (suspected) exposure to other viral communicable diseases: Secondary | ICD-10-CM | POA: Diagnosis not present

## 2019-03-09 DIAGNOSIS — Z191 Hormone sensitive malignancy status: Secondary | ICD-10-CM | POA: Diagnosis not present

## 2019-03-09 DIAGNOSIS — C61 Malignant neoplasm of prostate: Secondary | ICD-10-CM | POA: Diagnosis not present

## 2019-03-10 DIAGNOSIS — Z20828 Contact with and (suspected) exposure to other viral communicable diseases: Secondary | ICD-10-CM | POA: Diagnosis not present

## 2019-03-10 DIAGNOSIS — Z510 Encounter for antineoplastic radiation therapy: Secondary | ICD-10-CM | POA: Diagnosis not present

## 2019-03-10 DIAGNOSIS — F17210 Nicotine dependence, cigarettes, uncomplicated: Secondary | ICD-10-CM | POA: Diagnosis not present

## 2019-03-10 DIAGNOSIS — Z191 Hormone sensitive malignancy status: Secondary | ICD-10-CM | POA: Diagnosis not present

## 2019-03-10 DIAGNOSIS — C61 Malignant neoplasm of prostate: Secondary | ICD-10-CM | POA: Diagnosis not present

## 2019-03-11 DIAGNOSIS — Z20828 Contact with and (suspected) exposure to other viral communicable diseases: Secondary | ICD-10-CM | POA: Diagnosis not present

## 2019-03-11 DIAGNOSIS — Z191 Hormone sensitive malignancy status: Secondary | ICD-10-CM | POA: Diagnosis not present

## 2019-03-11 DIAGNOSIS — Z510 Encounter for antineoplastic radiation therapy: Secondary | ICD-10-CM | POA: Diagnosis not present

## 2019-03-11 DIAGNOSIS — C61 Malignant neoplasm of prostate: Secondary | ICD-10-CM | POA: Diagnosis not present

## 2019-03-11 DIAGNOSIS — F17210 Nicotine dependence, cigarettes, uncomplicated: Secondary | ICD-10-CM | POA: Diagnosis not present

## 2019-03-15 DIAGNOSIS — Z191 Hormone sensitive malignancy status: Secondary | ICD-10-CM | POA: Diagnosis not present

## 2019-03-15 DIAGNOSIS — F17210 Nicotine dependence, cigarettes, uncomplicated: Secondary | ICD-10-CM | POA: Diagnosis not present

## 2019-03-15 DIAGNOSIS — Z510 Encounter for antineoplastic radiation therapy: Secondary | ICD-10-CM | POA: Diagnosis not present

## 2019-03-15 DIAGNOSIS — Z20828 Contact with and (suspected) exposure to other viral communicable diseases: Secondary | ICD-10-CM | POA: Diagnosis not present

## 2019-03-15 DIAGNOSIS — C61 Malignant neoplasm of prostate: Secondary | ICD-10-CM | POA: Diagnosis not present

## 2019-03-16 DIAGNOSIS — Z20828 Contact with and (suspected) exposure to other viral communicable diseases: Secondary | ICD-10-CM | POA: Diagnosis not present

## 2019-03-16 DIAGNOSIS — C61 Malignant neoplasm of prostate: Secondary | ICD-10-CM | POA: Diagnosis not present

## 2019-03-16 DIAGNOSIS — F17210 Nicotine dependence, cigarettes, uncomplicated: Secondary | ICD-10-CM | POA: Diagnosis not present

## 2019-03-16 DIAGNOSIS — Z510 Encounter for antineoplastic radiation therapy: Secondary | ICD-10-CM | POA: Diagnosis not present

## 2019-03-16 DIAGNOSIS — Z191 Hormone sensitive malignancy status: Secondary | ICD-10-CM | POA: Diagnosis not present

## 2019-03-17 DIAGNOSIS — Z510 Encounter for antineoplastic radiation therapy: Secondary | ICD-10-CM | POA: Diagnosis not present

## 2019-03-17 DIAGNOSIS — Z191 Hormone sensitive malignancy status: Secondary | ICD-10-CM | POA: Diagnosis not present

## 2019-03-17 DIAGNOSIS — C61 Malignant neoplasm of prostate: Secondary | ICD-10-CM | POA: Diagnosis not present

## 2019-03-17 DIAGNOSIS — Z20828 Contact with and (suspected) exposure to other viral communicable diseases: Secondary | ICD-10-CM | POA: Diagnosis not present

## 2019-03-17 DIAGNOSIS — F17210 Nicotine dependence, cigarettes, uncomplicated: Secondary | ICD-10-CM | POA: Diagnosis not present

## 2019-03-18 DIAGNOSIS — Z191 Hormone sensitive malignancy status: Secondary | ICD-10-CM | POA: Diagnosis not present

## 2019-03-18 DIAGNOSIS — C61 Malignant neoplasm of prostate: Secondary | ICD-10-CM | POA: Diagnosis not present

## 2019-03-18 DIAGNOSIS — Z510 Encounter for antineoplastic radiation therapy: Secondary | ICD-10-CM | POA: Diagnosis not present

## 2019-03-18 DIAGNOSIS — F17210 Nicotine dependence, cigarettes, uncomplicated: Secondary | ICD-10-CM | POA: Diagnosis not present

## 2019-03-18 DIAGNOSIS — Z20828 Contact with and (suspected) exposure to other viral communicable diseases: Secondary | ICD-10-CM | POA: Diagnosis not present

## 2019-03-21 DIAGNOSIS — Z20828 Contact with and (suspected) exposure to other viral communicable diseases: Secondary | ICD-10-CM | POA: Diagnosis not present

## 2019-03-21 DIAGNOSIS — Z510 Encounter for antineoplastic radiation therapy: Secondary | ICD-10-CM | POA: Diagnosis not present

## 2019-03-21 DIAGNOSIS — Z191 Hormone sensitive malignancy status: Secondary | ICD-10-CM | POA: Diagnosis not present

## 2019-03-21 DIAGNOSIS — C61 Malignant neoplasm of prostate: Secondary | ICD-10-CM | POA: Diagnosis not present

## 2019-03-21 DIAGNOSIS — F17210 Nicotine dependence, cigarettes, uncomplicated: Secondary | ICD-10-CM | POA: Diagnosis not present

## 2019-03-22 DIAGNOSIS — F339 Major depressive disorder, recurrent, unspecified: Secondary | ICD-10-CM | POA: Diagnosis not present

## 2019-03-22 DIAGNOSIS — R972 Elevated prostate specific antigen [PSA]: Secondary | ICD-10-CM | POA: Diagnosis not present

## 2019-03-22 DIAGNOSIS — Z191 Hormone sensitive malignancy status: Secondary | ICD-10-CM | POA: Diagnosis not present

## 2019-03-22 DIAGNOSIS — Z510 Encounter for antineoplastic radiation therapy: Secondary | ICD-10-CM | POA: Diagnosis not present

## 2019-03-22 DIAGNOSIS — C61 Malignant neoplasm of prostate: Secondary | ICD-10-CM | POA: Diagnosis not present

## 2019-03-22 DIAGNOSIS — F17210 Nicotine dependence, cigarettes, uncomplicated: Secondary | ICD-10-CM | POA: Diagnosis not present

## 2019-03-22 DIAGNOSIS — Z20828 Contact with and (suspected) exposure to other viral communicable diseases: Secondary | ICD-10-CM | POA: Diagnosis not present

## 2019-03-23 DIAGNOSIS — Z510 Encounter for antineoplastic radiation therapy: Secondary | ICD-10-CM | POA: Diagnosis not present

## 2019-03-23 DIAGNOSIS — Z20828 Contact with and (suspected) exposure to other viral communicable diseases: Secondary | ICD-10-CM | POA: Diagnosis not present

## 2019-03-23 DIAGNOSIS — F17210 Nicotine dependence, cigarettes, uncomplicated: Secondary | ICD-10-CM | POA: Diagnosis not present

## 2019-03-23 DIAGNOSIS — C61 Malignant neoplasm of prostate: Secondary | ICD-10-CM | POA: Diagnosis not present

## 2019-03-23 DIAGNOSIS — Z191 Hormone sensitive malignancy status: Secondary | ICD-10-CM | POA: Diagnosis not present

## 2019-03-24 DIAGNOSIS — Z20828 Contact with and (suspected) exposure to other viral communicable diseases: Secondary | ICD-10-CM | POA: Diagnosis not present

## 2019-03-24 DIAGNOSIS — F17210 Nicotine dependence, cigarettes, uncomplicated: Secondary | ICD-10-CM | POA: Diagnosis not present

## 2019-03-24 DIAGNOSIS — Z191 Hormone sensitive malignancy status: Secondary | ICD-10-CM | POA: Diagnosis not present

## 2019-03-24 DIAGNOSIS — C61 Malignant neoplasm of prostate: Secondary | ICD-10-CM | POA: Diagnosis not present

## 2019-03-24 DIAGNOSIS — Z510 Encounter for antineoplastic radiation therapy: Secondary | ICD-10-CM | POA: Diagnosis not present

## 2019-03-25 DIAGNOSIS — Z20828 Contact with and (suspected) exposure to other viral communicable diseases: Secondary | ICD-10-CM | POA: Diagnosis not present

## 2019-03-25 DIAGNOSIS — F17210 Nicotine dependence, cigarettes, uncomplicated: Secondary | ICD-10-CM | POA: Diagnosis not present

## 2019-03-25 DIAGNOSIS — C61 Malignant neoplasm of prostate: Secondary | ICD-10-CM | POA: Diagnosis not present

## 2019-03-25 DIAGNOSIS — Z191 Hormone sensitive malignancy status: Secondary | ICD-10-CM | POA: Diagnosis not present

## 2019-03-25 DIAGNOSIS — Z510 Encounter for antineoplastic radiation therapy: Secondary | ICD-10-CM | POA: Diagnosis not present

## 2019-03-28 DIAGNOSIS — F17210 Nicotine dependence, cigarettes, uncomplicated: Secondary | ICD-10-CM | POA: Diagnosis not present

## 2019-03-28 DIAGNOSIS — Z20828 Contact with and (suspected) exposure to other viral communicable diseases: Secondary | ICD-10-CM | POA: Diagnosis not present

## 2019-03-28 DIAGNOSIS — Z510 Encounter for antineoplastic radiation therapy: Secondary | ICD-10-CM | POA: Diagnosis not present

## 2019-03-28 DIAGNOSIS — Z191 Hormone sensitive malignancy status: Secondary | ICD-10-CM | POA: Diagnosis not present

## 2019-03-28 DIAGNOSIS — C61 Malignant neoplasm of prostate: Secondary | ICD-10-CM | POA: Diagnosis not present

## 2019-03-29 DIAGNOSIS — C61 Malignant neoplasm of prostate: Secondary | ICD-10-CM | POA: Diagnosis not present

## 2019-03-29 DIAGNOSIS — Z20828 Contact with and (suspected) exposure to other viral communicable diseases: Secondary | ICD-10-CM | POA: Diagnosis not present

## 2019-03-29 DIAGNOSIS — Z510 Encounter for antineoplastic radiation therapy: Secondary | ICD-10-CM | POA: Diagnosis not present

## 2019-03-29 DIAGNOSIS — F17210 Nicotine dependence, cigarettes, uncomplicated: Secondary | ICD-10-CM | POA: Diagnosis not present

## 2019-03-29 DIAGNOSIS — Z191 Hormone sensitive malignancy status: Secondary | ICD-10-CM | POA: Diagnosis not present

## 2019-03-30 DIAGNOSIS — C61 Malignant neoplasm of prostate: Secondary | ICD-10-CM | POA: Diagnosis not present

## 2019-03-30 DIAGNOSIS — Z191 Hormone sensitive malignancy status: Secondary | ICD-10-CM | POA: Diagnosis not present

## 2019-03-30 DIAGNOSIS — Z510 Encounter for antineoplastic radiation therapy: Secondary | ICD-10-CM | POA: Diagnosis not present

## 2019-03-30 DIAGNOSIS — Z20828 Contact with and (suspected) exposure to other viral communicable diseases: Secondary | ICD-10-CM | POA: Diagnosis not present

## 2019-03-30 DIAGNOSIS — F17210 Nicotine dependence, cigarettes, uncomplicated: Secondary | ICD-10-CM | POA: Diagnosis not present

## 2019-03-31 DIAGNOSIS — Z191 Hormone sensitive malignancy status: Secondary | ICD-10-CM | POA: Diagnosis not present

## 2019-03-31 DIAGNOSIS — F17210 Nicotine dependence, cigarettes, uncomplicated: Secondary | ICD-10-CM | POA: Diagnosis not present

## 2019-03-31 DIAGNOSIS — C61 Malignant neoplasm of prostate: Secondary | ICD-10-CM | POA: Diagnosis not present

## 2019-03-31 DIAGNOSIS — Z20828 Contact with and (suspected) exposure to other viral communicable diseases: Secondary | ICD-10-CM | POA: Diagnosis not present

## 2019-03-31 DIAGNOSIS — Z510 Encounter for antineoplastic radiation therapy: Secondary | ICD-10-CM | POA: Diagnosis not present

## 2019-04-01 DIAGNOSIS — C61 Malignant neoplasm of prostate: Secondary | ICD-10-CM | POA: Diagnosis not present

## 2019-04-01 DIAGNOSIS — F17210 Nicotine dependence, cigarettes, uncomplicated: Secondary | ICD-10-CM | POA: Diagnosis not present

## 2019-04-01 DIAGNOSIS — Z191 Hormone sensitive malignancy status: Secondary | ICD-10-CM | POA: Diagnosis not present

## 2019-04-01 DIAGNOSIS — Z20828 Contact with and (suspected) exposure to other viral communicable diseases: Secondary | ICD-10-CM | POA: Diagnosis not present

## 2019-04-01 DIAGNOSIS — Z510 Encounter for antineoplastic radiation therapy: Secondary | ICD-10-CM | POA: Diagnosis not present

## 2019-04-04 DIAGNOSIS — Z510 Encounter for antineoplastic radiation therapy: Secondary | ICD-10-CM | POA: Diagnosis not present

## 2019-04-04 DIAGNOSIS — F17210 Nicotine dependence, cigarettes, uncomplicated: Secondary | ICD-10-CM | POA: Diagnosis not present

## 2019-04-04 DIAGNOSIS — Z191 Hormone sensitive malignancy status: Secondary | ICD-10-CM | POA: Diagnosis not present

## 2019-04-04 DIAGNOSIS — C61 Malignant neoplasm of prostate: Secondary | ICD-10-CM | POA: Diagnosis not present

## 2019-04-04 DIAGNOSIS — Z20828 Contact with and (suspected) exposure to other viral communicable diseases: Secondary | ICD-10-CM | POA: Diagnosis not present

## 2019-04-05 DIAGNOSIS — C61 Malignant neoplasm of prostate: Secondary | ICD-10-CM | POA: Diagnosis not present

## 2019-04-05 DIAGNOSIS — Z20828 Contact with and (suspected) exposure to other viral communicable diseases: Secondary | ICD-10-CM | POA: Diagnosis not present

## 2019-04-05 DIAGNOSIS — Z510 Encounter for antineoplastic radiation therapy: Secondary | ICD-10-CM | POA: Diagnosis not present

## 2019-04-05 DIAGNOSIS — F17210 Nicotine dependence, cigarettes, uncomplicated: Secondary | ICD-10-CM | POA: Diagnosis not present

## 2019-04-05 DIAGNOSIS — Z191 Hormone sensitive malignancy status: Secondary | ICD-10-CM | POA: Diagnosis not present

## 2019-04-06 DIAGNOSIS — Z20828 Contact with and (suspected) exposure to other viral communicable diseases: Secondary | ICD-10-CM | POA: Diagnosis not present

## 2019-04-06 DIAGNOSIS — Z191 Hormone sensitive malignancy status: Secondary | ICD-10-CM | POA: Diagnosis not present

## 2019-04-06 DIAGNOSIS — Z510 Encounter for antineoplastic radiation therapy: Secondary | ICD-10-CM | POA: Diagnosis not present

## 2019-04-06 DIAGNOSIS — F17210 Nicotine dependence, cigarettes, uncomplicated: Secondary | ICD-10-CM | POA: Diagnosis not present

## 2019-04-06 DIAGNOSIS — C61 Malignant neoplasm of prostate: Secondary | ICD-10-CM | POA: Diagnosis not present

## 2019-04-07 DIAGNOSIS — Z510 Encounter for antineoplastic radiation therapy: Secondary | ICD-10-CM | POA: Diagnosis not present

## 2019-04-07 DIAGNOSIS — C61 Malignant neoplasm of prostate: Secondary | ICD-10-CM | POA: Diagnosis not present

## 2019-04-07 DIAGNOSIS — Z191 Hormone sensitive malignancy status: Secondary | ICD-10-CM | POA: Diagnosis not present

## 2019-04-07 DIAGNOSIS — Z20828 Contact with and (suspected) exposure to other viral communicable diseases: Secondary | ICD-10-CM | POA: Diagnosis not present

## 2019-04-07 DIAGNOSIS — F17210 Nicotine dependence, cigarettes, uncomplicated: Secondary | ICD-10-CM | POA: Diagnosis not present

## 2019-04-08 DIAGNOSIS — C61 Malignant neoplasm of prostate: Secondary | ICD-10-CM | POA: Diagnosis not present

## 2019-04-08 DIAGNOSIS — Z191 Hormone sensitive malignancy status: Secondary | ICD-10-CM | POA: Diagnosis not present

## 2019-04-08 DIAGNOSIS — Z510 Encounter for antineoplastic radiation therapy: Secondary | ICD-10-CM | POA: Diagnosis not present

## 2019-04-08 DIAGNOSIS — Z20828 Contact with and (suspected) exposure to other viral communicable diseases: Secondary | ICD-10-CM | POA: Diagnosis not present

## 2019-04-08 DIAGNOSIS — F17210 Nicotine dependence, cigarettes, uncomplicated: Secondary | ICD-10-CM | POA: Diagnosis not present

## 2019-04-11 DIAGNOSIS — C61 Malignant neoplasm of prostate: Secondary | ICD-10-CM | POA: Diagnosis not present

## 2019-04-11 DIAGNOSIS — Z510 Encounter for antineoplastic radiation therapy: Secondary | ICD-10-CM | POA: Diagnosis not present

## 2019-04-11 DIAGNOSIS — Z20828 Contact with and (suspected) exposure to other viral communicable diseases: Secondary | ICD-10-CM | POA: Diagnosis not present

## 2019-04-11 DIAGNOSIS — F17210 Nicotine dependence, cigarettes, uncomplicated: Secondary | ICD-10-CM | POA: Diagnosis not present

## 2019-04-11 DIAGNOSIS — Z191 Hormone sensitive malignancy status: Secondary | ICD-10-CM | POA: Diagnosis not present

## 2019-04-12 DIAGNOSIS — Z510 Encounter for antineoplastic radiation therapy: Secondary | ICD-10-CM | POA: Diagnosis not present

## 2019-04-12 DIAGNOSIS — F17210 Nicotine dependence, cigarettes, uncomplicated: Secondary | ICD-10-CM | POA: Diagnosis not present

## 2019-04-12 DIAGNOSIS — C61 Malignant neoplasm of prostate: Secondary | ICD-10-CM | POA: Diagnosis not present

## 2019-04-12 DIAGNOSIS — Z191 Hormone sensitive malignancy status: Secondary | ICD-10-CM | POA: Diagnosis not present

## 2019-04-12 DIAGNOSIS — Z20828 Contact with and (suspected) exposure to other viral communicable diseases: Secondary | ICD-10-CM | POA: Diagnosis not present

## 2019-04-13 DIAGNOSIS — F17210 Nicotine dependence, cigarettes, uncomplicated: Secondary | ICD-10-CM | POA: Diagnosis not present

## 2019-04-13 DIAGNOSIS — Z510 Encounter for antineoplastic radiation therapy: Secondary | ICD-10-CM | POA: Diagnosis not present

## 2019-04-13 DIAGNOSIS — Z191 Hormone sensitive malignancy status: Secondary | ICD-10-CM | POA: Diagnosis not present

## 2019-04-13 DIAGNOSIS — Z20828 Contact with and (suspected) exposure to other viral communicable diseases: Secondary | ICD-10-CM | POA: Diagnosis not present

## 2019-04-13 DIAGNOSIS — C61 Malignant neoplasm of prostate: Secondary | ICD-10-CM | POA: Diagnosis not present

## 2019-04-14 DIAGNOSIS — C61 Malignant neoplasm of prostate: Secondary | ICD-10-CM | POA: Diagnosis not present

## 2019-04-14 DIAGNOSIS — Z191 Hormone sensitive malignancy status: Secondary | ICD-10-CM | POA: Diagnosis not present

## 2019-04-14 DIAGNOSIS — Z510 Encounter for antineoplastic radiation therapy: Secondary | ICD-10-CM | POA: Diagnosis not present

## 2019-04-14 DIAGNOSIS — Z20828 Contact with and (suspected) exposure to other viral communicable diseases: Secondary | ICD-10-CM | POA: Diagnosis not present

## 2019-04-14 DIAGNOSIS — F17210 Nicotine dependence, cigarettes, uncomplicated: Secondary | ICD-10-CM | POA: Diagnosis not present

## 2019-04-15 DIAGNOSIS — Z510 Encounter for antineoplastic radiation therapy: Secondary | ICD-10-CM | POA: Diagnosis not present

## 2019-04-15 DIAGNOSIS — Z191 Hormone sensitive malignancy status: Secondary | ICD-10-CM | POA: Diagnosis not present

## 2019-04-15 DIAGNOSIS — F17210 Nicotine dependence, cigarettes, uncomplicated: Secondary | ICD-10-CM | POA: Diagnosis not present

## 2019-04-15 DIAGNOSIS — Z20828 Contact with and (suspected) exposure to other viral communicable diseases: Secondary | ICD-10-CM | POA: Diagnosis not present

## 2019-04-15 DIAGNOSIS — C61 Malignant neoplasm of prostate: Secondary | ICD-10-CM | POA: Diagnosis not present

## 2019-04-18 DIAGNOSIS — Z191 Hormone sensitive malignancy status: Secondary | ICD-10-CM | POA: Diagnosis not present

## 2019-04-18 DIAGNOSIS — Z20828 Contact with and (suspected) exposure to other viral communicable diseases: Secondary | ICD-10-CM | POA: Diagnosis not present

## 2019-04-18 DIAGNOSIS — Z510 Encounter for antineoplastic radiation therapy: Secondary | ICD-10-CM | POA: Diagnosis not present

## 2019-04-18 DIAGNOSIS — C61 Malignant neoplasm of prostate: Secondary | ICD-10-CM | POA: Diagnosis not present

## 2019-04-19 DIAGNOSIS — Z191 Hormone sensitive malignancy status: Secondary | ICD-10-CM | POA: Diagnosis not present

## 2019-04-19 DIAGNOSIS — C61 Malignant neoplasm of prostate: Secondary | ICD-10-CM | POA: Diagnosis not present

## 2019-04-19 DIAGNOSIS — Z510 Encounter for antineoplastic radiation therapy: Secondary | ICD-10-CM | POA: Diagnosis not present

## 2019-04-19 DIAGNOSIS — Z20828 Contact with and (suspected) exposure to other viral communicable diseases: Secondary | ICD-10-CM | POA: Diagnosis not present

## 2019-04-20 DIAGNOSIS — C61 Malignant neoplasm of prostate: Secondary | ICD-10-CM | POA: Diagnosis not present

## 2019-04-20 DIAGNOSIS — Z510 Encounter for antineoplastic radiation therapy: Secondary | ICD-10-CM | POA: Diagnosis not present

## 2019-04-20 DIAGNOSIS — Z191 Hormone sensitive malignancy status: Secondary | ICD-10-CM | POA: Diagnosis not present

## 2019-04-20 DIAGNOSIS — Z20828 Contact with and (suspected) exposure to other viral communicable diseases: Secondary | ICD-10-CM | POA: Diagnosis not present

## 2019-04-21 DIAGNOSIS — Z20828 Contact with and (suspected) exposure to other viral communicable diseases: Secondary | ICD-10-CM | POA: Diagnosis not present

## 2019-04-21 DIAGNOSIS — Z191 Hormone sensitive malignancy status: Secondary | ICD-10-CM | POA: Diagnosis not present

## 2019-04-21 DIAGNOSIS — Z510 Encounter for antineoplastic radiation therapy: Secondary | ICD-10-CM | POA: Diagnosis not present

## 2019-04-21 DIAGNOSIS — C61 Malignant neoplasm of prostate: Secondary | ICD-10-CM | POA: Diagnosis not present

## 2019-04-22 DIAGNOSIS — Z191 Hormone sensitive malignancy status: Secondary | ICD-10-CM | POA: Diagnosis not present

## 2019-04-22 DIAGNOSIS — Z510 Encounter for antineoplastic radiation therapy: Secondary | ICD-10-CM | POA: Diagnosis not present

## 2019-04-22 DIAGNOSIS — R972 Elevated prostate specific antigen [PSA]: Secondary | ICD-10-CM | POA: Diagnosis not present

## 2019-04-22 DIAGNOSIS — C61 Malignant neoplasm of prostate: Secondary | ICD-10-CM | POA: Diagnosis not present

## 2019-04-22 DIAGNOSIS — F339 Major depressive disorder, recurrent, unspecified: Secondary | ICD-10-CM | POA: Diagnosis not present

## 2019-04-22 DIAGNOSIS — Z20828 Contact with and (suspected) exposure to other viral communicable diseases: Secondary | ICD-10-CM | POA: Diagnosis not present

## 2019-04-25 DIAGNOSIS — C61 Malignant neoplasm of prostate: Secondary | ICD-10-CM | POA: Diagnosis not present

## 2019-04-25 DIAGNOSIS — Z510 Encounter for antineoplastic radiation therapy: Secondary | ICD-10-CM | POA: Diagnosis not present

## 2019-04-25 DIAGNOSIS — Z191 Hormone sensitive malignancy status: Secondary | ICD-10-CM | POA: Diagnosis not present

## 2019-04-25 DIAGNOSIS — Z20828 Contact with and (suspected) exposure to other viral communicable diseases: Secondary | ICD-10-CM | POA: Diagnosis not present

## 2019-04-26 DIAGNOSIS — Z191 Hormone sensitive malignancy status: Secondary | ICD-10-CM | POA: Diagnosis not present

## 2019-04-26 DIAGNOSIS — Z20828 Contact with and (suspected) exposure to other viral communicable diseases: Secondary | ICD-10-CM | POA: Diagnosis not present

## 2019-04-26 DIAGNOSIS — C61 Malignant neoplasm of prostate: Secondary | ICD-10-CM | POA: Diagnosis not present

## 2019-04-26 DIAGNOSIS — Z510 Encounter for antineoplastic radiation therapy: Secondary | ICD-10-CM | POA: Diagnosis not present

## 2019-04-27 DIAGNOSIS — C61 Malignant neoplasm of prostate: Secondary | ICD-10-CM | POA: Diagnosis not present

## 2019-04-27 DIAGNOSIS — Z20828 Contact with and (suspected) exposure to other viral communicable diseases: Secondary | ICD-10-CM | POA: Diagnosis not present

## 2019-04-27 DIAGNOSIS — Z510 Encounter for antineoplastic radiation therapy: Secondary | ICD-10-CM | POA: Diagnosis not present

## 2019-04-27 DIAGNOSIS — Z191 Hormone sensitive malignancy status: Secondary | ICD-10-CM | POA: Diagnosis not present

## 2019-04-28 DIAGNOSIS — Z191 Hormone sensitive malignancy status: Secondary | ICD-10-CM | POA: Diagnosis not present

## 2019-04-28 DIAGNOSIS — C61 Malignant neoplasm of prostate: Secondary | ICD-10-CM | POA: Diagnosis not present

## 2019-04-28 DIAGNOSIS — Z20828 Contact with and (suspected) exposure to other viral communicable diseases: Secondary | ICD-10-CM | POA: Diagnosis not present

## 2019-04-28 DIAGNOSIS — Z510 Encounter for antineoplastic radiation therapy: Secondary | ICD-10-CM | POA: Diagnosis not present

## 2019-04-29 DIAGNOSIS — C61 Malignant neoplasm of prostate: Secondary | ICD-10-CM | POA: Diagnosis not present

## 2019-04-29 DIAGNOSIS — Z510 Encounter for antineoplastic radiation therapy: Secondary | ICD-10-CM | POA: Diagnosis not present

## 2019-04-29 DIAGNOSIS — Z191 Hormone sensitive malignancy status: Secondary | ICD-10-CM | POA: Diagnosis not present

## 2019-04-29 DIAGNOSIS — Z20828 Contact with and (suspected) exposure to other viral communicable diseases: Secondary | ICD-10-CM | POA: Diagnosis not present

## 2019-05-02 DIAGNOSIS — C61 Malignant neoplasm of prostate: Secondary | ICD-10-CM | POA: Diagnosis not present

## 2019-05-02 DIAGNOSIS — Z510 Encounter for antineoplastic radiation therapy: Secondary | ICD-10-CM | POA: Diagnosis not present

## 2019-05-02 DIAGNOSIS — Z191 Hormone sensitive malignancy status: Secondary | ICD-10-CM | POA: Diagnosis not present

## 2019-05-02 DIAGNOSIS — Z20828 Contact with and (suspected) exposure to other viral communicable diseases: Secondary | ICD-10-CM | POA: Diagnosis not present

## 2019-05-22 DIAGNOSIS — C61 Malignant neoplasm of prostate: Secondary | ICD-10-CM | POA: Diagnosis not present

## 2019-05-22 DIAGNOSIS — F339 Major depressive disorder, recurrent, unspecified: Secondary | ICD-10-CM | POA: Diagnosis not present

## 2019-06-14 ENCOUNTER — Encounter: Payer: Self-pay | Admitting: Urology

## 2019-07-22 ENCOUNTER — Other Ambulatory Visit: Payer: Self-pay

## 2019-07-22 ENCOUNTER — Other Ambulatory Visit (HOSPITAL_COMMUNITY): Payer: Self-pay | Admitting: Internal Medicine

## 2019-07-22 ENCOUNTER — Ambulatory Visit (HOSPITAL_COMMUNITY)
Admission: RE | Admit: 2019-07-22 | Discharge: 2019-07-22 | Disposition: A | Discharge status: Home / Self Care | Payer: Medicare Other | Source: Ambulatory Visit | Attending: Internal Medicine | Admitting: Internal Medicine

## 2019-07-22 DIAGNOSIS — M25511 Pain in right shoulder: Secondary | ICD-10-CM | POA: Diagnosis not present

## 2019-07-22 DIAGNOSIS — E43 Unspecified severe protein-calorie malnutrition: Secondary | ICD-10-CM | POA: Diagnosis not present

## 2019-07-22 DIAGNOSIS — Z0001 Encounter for general adult medical examination with abnormal findings: Secondary | ICD-10-CM | POA: Diagnosis not present

## 2019-07-22 DIAGNOSIS — Z1389 Encounter for screening for other disorder: Secondary | ICD-10-CM | POA: Diagnosis not present

## 2019-07-22 DIAGNOSIS — F17210 Nicotine dependence, cigarettes, uncomplicated: Secondary | ICD-10-CM | POA: Diagnosis not present

## 2019-07-22 DIAGNOSIS — M1990 Unspecified osteoarthritis, unspecified site: Secondary | ICD-10-CM | POA: Diagnosis not present

## 2019-07-22 DIAGNOSIS — J410 Simple chronic bronchitis: Secondary | ICD-10-CM | POA: Diagnosis not present

## 2019-07-22 DIAGNOSIS — Z23 Encounter for immunization: Secondary | ICD-10-CM | POA: Diagnosis not present

## 2019-08-08 ENCOUNTER — Other Ambulatory Visit: Payer: Self-pay

## 2019-08-08 ENCOUNTER — Ambulatory Visit (INDEPENDENT_AMBULATORY_CARE_PROVIDER_SITE_OTHER): Payer: Medicare Other | Admitting: Critical Care Medicine

## 2019-08-08 ENCOUNTER — Telehealth: Payer: Self-pay | Admitting: Critical Care Medicine

## 2019-08-08 ENCOUNTER — Encounter: Payer: Self-pay | Admitting: Critical Care Medicine

## 2019-08-08 VITALS — BP 108/80 | HR 85 | Temp 97.3°F | Ht 68.0 in | Wt 146.6 lb

## 2019-08-08 DIAGNOSIS — R0600 Dyspnea, unspecified: Secondary | ICD-10-CM

## 2019-08-08 DIAGNOSIS — R0609 Other forms of dyspnea: Secondary | ICD-10-CM

## 2019-08-08 DIAGNOSIS — Z720 Tobacco use: Secondary | ICD-10-CM

## 2019-08-08 DIAGNOSIS — R911 Solitary pulmonary nodule: Secondary | ICD-10-CM

## 2019-08-08 MED ORDER — SPIRIVA RESPIMAT 1.25 MCG/ACT IN AERS: 2.0000 | INHALATION_SPRAY | Freq: Every day | RESPIRATORY_TRACT | 0 refills | Status: DC

## 2019-08-08 MED ORDER — SPIRIVA RESPIMAT 1.25 MCG/ACT IN AERS: 1.0000 | INHALATION_SPRAY | Freq: Every day | RESPIRATORY_TRACT | 11 refills | Status: DC

## 2019-08-08 NOTE — Telephone Encounter (Signed)
Spoke with the pt's daughter  She states that spiriva will cost pt $500  I advised that they need to call her insurance and check drug formulary list  Pt has a sample and will use this while they are calling to find out alternatives  She will call back once has the list

## 2019-08-08 NOTE — Patient Instructions (Addendum)
Thank you for visiting Dr. Carlis Abbott at [facility]. We recommend the following: Orders Placed This Encounter  Procedures  . CT Chest Wo Contrast  . Pulmonary Function Test   Orders Placed This Encounter  Procedures  . CT Chest Wo Contrast    Standing Status:   Future    Standing Expiration Date:   10/05/2020    Order Specific Question:   ** REASON FOR EXAM (FREE TEXT)    Answer:   follow up small AP window convexity, smoker, history of prostate cancer    Order Specific Question:   Preferred imaging location?    Answer:   [facility]    Order Specific Question:   Radiology Contrast Protocol - do NOT remove file path    Answer:   [email or web address]  . Pulmonary Function Test    Standing Status:   Future    Standing Expiration Date:   08/07/2020    Order Specific Question:   Where should this test be performed?    Answer:   [facility]    Order Specific Question:   Full PFT: includes the following: basic spirometry, spirometry pre & post bronchodilator, diffusion capacity (DLCO), lung volumes    Answer:   Full PFT    Meds ordered this encounter  Medications  . Tiotropium Bromide Monohydrate (SPIRIVA RESPIMAT) 1.25 MCG/ACT AERS    Sig: Inhale 1 puff into the lungs daily.    Dispense:  4 g    Refill:  11    Return in about 3 months (around 11/05/2019). after PFTs.    Please do your part to reduce the spread of COVID-19.

## 2019-08-08 NOTE — Progress Notes (Signed)
Synopsis: Referred in February 2021 for SOB by Rosita Fire, MD.  Subjective:   PATIENT ID: Joseph Gentry GENDER: male DOB: May 25, 1951, MRN: [id number]  Chief Complaint  Patient presents with  . Consult    Patient his here for COPD. Patient has some shortness of breath with exertion or when he jumps up to quick.     Joseph Gentry is a 69 year old woman who presents for evaluation of shortness of breath.  He is accompanied by his daughter for his visit, whom he lives with.  He was previously seen by Dr. Luan Pulling, but due to Covid has not had a full work-up.  He began having shortness of breath, mostly with exertion or associated with position changes in the spring 2020.  He is on a Ventolin inhaler which he uses twice daily in the morning and at night, sometimes a third time per day due to wheezing.  He has improvement in his symptoms with albuterol use.  He has a cough with chronic sputum production, which is increased as he has been cutting back on smoking cigarettes.  He has never received antibiotics or prednisone for respiratory infections.  At his heaviest he smoked half pack per day, but has recently cut down to a few cigarettes a day, about 1 pack every 2 weeks.  He started smoking 55 years ago around age 21.  He is up-to-date on his seasonal flu and one pneumonia shot, and is on the waiting list for Covid vaccine.  No previous history of lung disease or family history of lung disease.      Past Medical History:  Diagnosis Date  . GERD (gastroesophageal reflux disease)   . Prostate cancer [facility])      Family History  Problem Relation Age of Onset  . Prostate cancer Neg Hx    Unknown family medical history.  Past Surgical History:  Procedure Laterality Date  . NOSE SURGERY      Social History   Socioeconomic History  . Marital status: Divorced    Spouse name: Not on file  . Number of children: 2  . Years of education: Not on file  . Highest education level: Not on file    Occupational History  . Occupation: retired    Comment: Equities trader  Tobacco Use  . Smoking status: Current Every Day Smoker    Packs/day: 1.00    Types: Cigarettes  . Smokeless tobacco: Never Used  . Tobacco comment: a pack every 2 weeks  Substance and Sexual Activity  . Alcohol use: Yes    Alcohol/week: 6.0 standard drinks    Types: 6 Cans of beer per week    Comment: daily  . Drug use: Not Currently    Types: Cocaine, Marijuana  . Sexual activity: Not Currently  Other Topics Concern  . Not on file  Social History Narrative  . Not on file   Social Determinants of Health   Financial Resource Strain:   . Difficulty of Paying Living Expenses: Not on file  Food Insecurity:   . Worried About Charity fundraiser in the Last Year: Not on file  . Ran Out of Food in the Last Year: Not on file  Transportation Needs:   . Lack of Transportation (Medical): Not on file  . Lack of Transportation (Non-Medical): Not on file  Physical Activity:   . Days of Exercise per Week: Not on file  . Minutes of Exercise per Session: Not on file  Stress:   .  Feeling of Stress : Not on file  Social Connections:   . Frequency of Communication with Friends and Family: Not on file  . Frequency of Social Gatherings with Friends and Family: Not on file  . Attends Religious Services: Not on file  . Active Member of Clubs or Organizations: Not on file  . Attends Archivist Meetings: Not on file  . Marital Status: Not on file  Intimate Partner Violence:   . Fear of Current or Ex-Partner: Not on file  . Emotionally Abused: Not on file  . Physically Abused: Not on file  . Sexually Abused: Not on file     No Known Allergies   Immunization History  Administered Date(s) Administered  . DTaP 10/10/2011  . Influenza,inj,Quad PF,6-35 Mos 05/08/2019    Outpatient Medications Prior to Visit  Medication Sig Dispense Refill  . Multiple Vitamin (MULTIVITAMIN WITH MINERALS) TABS tablet Take 1  tablet by mouth daily.    Marland Kitchen omeprazole (PRILOSEC) 40 MG capsule Take 40 mg by mouth daily.    . traMADol (ULTRAM) 50 MG tablet Take 1 tablet (50 mg total) by mouth every 6 (six) hours as needed. 15 tablet 0  . VENTOLIN HFA 108 (90 Base) MCG/ACT inhaler     . levofloxacin (LEVAQUIN) 750 MG tablet TK 1 T PO MORNING OF BIOPSY    . clotrimazole-betamethasone (LOTRISONE) cream APP EXT TO RASH BID  3   No facility-administered medications prior to visit.    Review of Systems  Constitutional:       Regaining weight after previous weight loss attributable to cancer  Respiratory: Positive for cough, sputum production, shortness of breath and wheezing.   Cardiovascular: Negative for leg swelling.  Musculoskeletal:       Bilateral shoulder pain, some hand stiffness bilaterally     Objective:   Vitals:   08/08/19 0939  BP: 108/80  Pulse: 85  Temp: (!) 97.3 F (36.3 C)  TempSrc: Temporal  SpO2: 91%  Weight: 146 lb 9.6 oz (66.5 kg)  Height: 5\' 8"  (1.727 m)   91% on   RA BMI Readings from Last 3 Encounters:  08/08/19 22.29 kg/m  03/30/18 17.92 kg/m  07/03/17 20.23 kg/m   Wt Readings from Last 3 Encounters:  08/08/19 146 lb 9.6 oz (66.5 kg)  03/30/18 119 lb 9.6 oz (54.3 kg)  07/03/17 135 lb (61.2 kg)    Physical Exam Vitals reviewed.  Constitutional:      Appearance: Normal appearance.     Comments: Frail, chronically ill-appearing  HENT:     Head: Normocephalic and atraumatic.  Eyes:     General: No scleral icterus. Cardiovascular:     Rate and Rhythm: Normal rate and regular rhythm.     Heart sounds: No murmur.  Pulmonary:     Comments: Breathing comfortably on room air, no conversational dyspnea.  Reduced breath sounds bilaterally, no rhonchi or wheezing.  Witnessed coughing. Abdominal:     General: There is no distension.  Musculoskeletal:        General: No swelling.     Cervical back: Neck supple.     Comments: Clubbing of all digits--chronic per daughter.    Skin:    General: Skin is warm and dry.     Findings: No rash.  Neurological:     General: No focal deficit present.     Mental Status: He is alert.     Coordination: Coordination normal.  Psychiatric:        Mood and  Affect: Mood normal.        Behavior: Behavior normal.      CBC    Component Value Date/Time   WBC 6.4 12/18/2015 1745   RBC 3.99 (L) 12/18/2015 1745   HGB 15.6 12/18/2015 1756   HCT 46.0 12/18/2015 1756   PLT 229 12/18/2015 1745   MCV 100.3 (H) 12/18/2015 1745   MCH 35.1 (H) 12/18/2015 1745   MCHC 35.0 12/18/2015 1745   RDW 13.8 12/18/2015 1745   LYMPHSABS 1.7 12/18/2015 1745   MONOABS 0.6 12/18/2015 1745   EOSABS 0.4 12/18/2015 1745   BASOSABS 0.1 12/18/2015 1745    CHEMISTRY No results for input(s): NA, K, CL, CO2, GLUCOSE, BUN, CREATININE, CALCIUM, MG, PHOS in the last 168 hours. CrCl cannot be calculated (Patient's most recent lab result is older than the maximum 21 days allowed.).   Chest Imaging- films reviewed: CXR, 2 view 08/23/2018-hyperinflated with flattened hemidiaphragms.  Small convexity and AP window, possibly lymph node.  Pulmonary Functions Testing Results: No flowsheet data found.      Assessment & Plan:     ICD-10-CM   1. Tobacco abuse  Z72.0 Pulmonary Function Test  2. Solitary pulmonary nodule  R91.1 Pulmonary Function Test    CT Chest Wo Contrast  3. DOE (dyspnea on exertion)  R06.00   4. Chronic respiratory failure with hypoxia (HCC)  J96.11     Dyspnea on exertion, concern for COPD -Start Spiriva Respimat, 1 puff once daily.  Sample inhaler provided with training. -Walk in the office to determine if desaturation occurs. -Continue albuterol as needed -PFTs -Discussed the importance of avoiding exacerbations in the future-especially avoiding sick contacts, hand hygiene, remaining up-to-date on vaccines.  Agree with plan for Covid vaccine and receiving a second pneumonia vaccine.  Abnormal chest x-ray, history of  tobacco abuse -CT chest  Tobacco abuse -Recommend total smoking cessation.  Discussed methods that may help him, including nicotine replacement therapy.  He is working on quitting entirely.  Stress this is the main barrier.   RTC in 2 to 3 months after PFTs.   Current Outpatient Medications:  Marland Kitchen  Multiple Vitamin (MULTIVITAMIN WITH MINERALS) TABS tablet, Take 1 tablet by mouth daily., Disp: , Rfl:  .  omeprazole (PRILOSEC) 40 MG capsule, Take 40 mg by mouth daily., Disp: , Rfl:  .  traMADol (ULTRAM) 50 MG tablet, Take 1 tablet (50 mg total) by mouth every 6 (six) hours as needed., Disp: 15 tablet, Rfl: 0 .  VENTOLIN HFA 108 (90 Base) MCG/ACT inhaler, , Disp: , Rfl:  .  levofloxacin (LEVAQUIN) 750 MG tablet, TK 1 T PO MORNING OF BIOPSY, Disp: , Rfl:  .  Tiotropium Bromide Monohydrate (SPIRIVA RESPIMAT) 1.25 MCG/ACT AERS, Inhale 1 puff into the lungs daily., Disp: 4 g, Rfl: [place] Tyson Parkison, DO [facility] 08/08/2019 10:38 AM

## 2019-08-11 NOTE — Telephone Encounter (Signed)
Called and spoke pt's daughter, Otila Kluver. Otila Kluver states the pt was able to get the spiriva approved and received the medication at no cost. Pt is also trying to see if there are other locations he can have his PFT prior to May when he is scheduled. [name] aware to call our office back if they get pt scheduled somewhere else. Nothing further needed at this time. Will sign off.

## 2019-08-12 ENCOUNTER — Telehealth: Payer: Self-pay | Admitting: Critical Care Medicine

## 2019-08-12 ENCOUNTER — Ambulatory Visit: Payer: Medicare Other | Attending: Internal Medicine

## 2019-08-12 DIAGNOSIS — Z23 Encounter for immunization: Secondary | ICD-10-CM | POA: Insufficient documentation

## 2019-08-12 NOTE — Telephone Encounter (Signed)
Called Otila Kluver and she she advised will need to call us back  Will await her call back

## 2019-08-12 NOTE — Progress Notes (Signed)
   Covid-19 Vaccination Clinic  Name:  ANVITH HARNISCH    MRN: [id number] DOB: Dec 03, 1950  08/12/2019  Mr. Sasek was observed post Covid-19 immunization for 15 minutes without incidence. He was provided with Vaccine Information Sheet and instruction to access the V-Safe system.   Mr. Hindley was instructed to call 911 with any severe reactions post vaccine: Marland Kitchen Difficulty breathing  . Swelling of your face and throat  . A fast heartbeat  . A bad rash all over your body  . Dizziness and weakness    Immunizations Administered    Name Date Dose VIS Date Route   Pfizer COVID-19 Vaccine 08/12/2019 12:22 PM 0.3 mL 05/27/2019 Intramuscular   Manufacturer: [place]   Lot: [id number]   [place]: [id number]

## 2019-08-15 NOTE — Telephone Encounter (Signed)
Called and spoke to pt's daughter, Joseph Gentry. Pt is requesting to push his CT chest out 4 more weeks. Currently it is scheduled for 3/8.   Dr. Carlis Abbott is this ok to reschedule to early April? Thanks.  Will send to [facility] if ok to reschedule, [name] doesn't need a call back.

## 2019-08-15 NOTE — Telephone Encounter (Signed)
PCC's, can you reschedule pt's CT chest for early April? Thanks.

## 2019-08-15 NOTE — Telephone Encounter (Signed)
That is fine with me if that is what he needs. Thanks for checking!  Arby Barrette

## 2019-08-16 NOTE — Telephone Encounter (Signed)
I am not sure if the CT she is referring to is an abdominal CT scan performed in March 2020, in which case that does not show me the entirety of his lungs, so it would not take the place of a necessary CT scan. He had bone scans done for his prostate cancer, but again, not a substitute for a chest CT. The area of his CXR that is abnormal is not something that I think would be well explained by a rib fracture. To answer the question- yes I think it would still be wise to have the CT scan performed.   LPC

## 2019-08-16 NOTE — Telephone Encounter (Signed)
Called and spoke with pt's daughter Zigmund Gottron and provided her with pt's CT appt info. [name] verbalized understanding. Nothing further needed.

## 2019-08-16 NOTE — Telephone Encounter (Signed)
I called dtr [name] to see if she wants me to reschedule CT at [facility] or does she want to schedule.  Pt was VERY upset.  She states her question was not if ok to reschedule CT - she states she wants to know if Dr Carlis Abbott saw CT pt had done last year and if she feels he needs another CT.  She said her sister brought pt to appt with Dr Carlis Abbott and Dr Carlis Abbott mentioned a spot on CT she wanted to follow up on and she wants Dr Carlis Abbott to know pt had broken rib in the past and didn't know if spot could have come from that.  She asked her sister if this was mentioned to Dr Carlis Abbott and she doesn't know if they told her about broken rib.  I asked Zigmund Gottron where previous CT was done and again she got VERY upset and states she does not know but feels it is our due deligance to find out.  She doesn't want him to have CT's just for the sake of having them and she wants to know if Dr Carlis Abbott saw the CT from last year.  If so and she wants another one done that is fine and she would like to move it out a few weeks of so.

## 2019-08-16 NOTE — Telephone Encounter (Signed)
Called and spoke with pt's daughter Zigmund Gottron letting her know the info stated by Dr. Carlis Abbott. [name] verbalized understanding. Zigmund Gottron stated she is fine with pt's CT being performed but wants it to be pushed out to April. Judeen Hammans, please advise.

## 2019-08-16 NOTE — Telephone Encounter (Signed)
I will work on this. °

## 2019-08-16 NOTE — Telephone Encounter (Signed)
Dr. Carlis Abbott, please advise on this.

## 2019-08-16 NOTE — Telephone Encounter (Signed)
I have rescheduled CT to 4/14 at 10:00, arrive 9:45 at [facility].  Gave appt info to [facility] with [employer] #.  She is going to call [name] and give her appt info.

## 2019-08-19 ENCOUNTER — Encounter: Payer: Self-pay | Admitting: Urology

## 2019-08-19 ENCOUNTER — Other Ambulatory Visit: Payer: Self-pay

## 2019-08-19 ENCOUNTER — Other Ambulatory Visit: Payer: Self-pay | Admitting: Urology

## 2019-08-19 ENCOUNTER — Ambulatory Visit (INDEPENDENT_AMBULATORY_CARE_PROVIDER_SITE_OTHER): Payer: Medicare Other | Admitting: Urology

## 2019-08-19 VITALS — BP 119/81 | HR 108 | Temp 96.7°F | Ht 68.5 in | Wt 149.0 lb

## 2019-08-19 DIAGNOSIS — N138 Other obstructive and reflux uropathy: Secondary | ICD-10-CM | POA: Diagnosis not present

## 2019-08-19 DIAGNOSIS — R3912 Poor urinary stream: Secondary | ICD-10-CM | POA: Insufficient documentation

## 2019-08-19 DIAGNOSIS — N401 Enlarged prostate with lower urinary tract symptoms: Secondary | ICD-10-CM | POA: Diagnosis not present

## 2019-08-19 DIAGNOSIS — C61 Malignant neoplasm of prostate: Secondary | ICD-10-CM

## 2019-08-19 MED ORDER — TAMSULOSIN HCL 0.4 MG PO CAPS: 0.4000 mg | ORAL_CAPSULE | Freq: Every day | ORAL | 3 refills | Status: DC

## 2019-08-19 MED ORDER — LEUPROLIDE ACETATE (6 MONTH) 45 MG IM KIT
45.0000 mg | PACK | Freq: Once | INTRAMUSCULAR | Status: AC
  Administered 2019-08-19: 45 mg via INTRAMUSCULAR

## 2019-08-19 NOTE — Patient Instructions (Signed)
Prostate Cancer  The prostate is a male gland that helps make semen. Prostate cancer is when abnormal cells grow in this gland. Follow these instructions at home:  Take over-the-counter and prescription medicines only as told by your doctor.  Eat a healthy diet.  Get plenty of sleep.  Ask your doctor for help to find a support group for men with prostate cancer.  Keep all follow-up visits as told by your doctor. This is important.  If you have to go to the hospital, let your cancer doctor (oncologist) know.  Touch, hold, hug, and caress your partner to continue to show sexual feelings. Contact a doctor if:  You have trouble peeing (urinating).  You have blood in your pee (urine).  You have pain in your hips, back, or chest. Get help right away if:  You have weakness in your legs.  You lose feeling (have numbness) in your legs.  You cannot control your pee or your poop (stool).  You have trouble breathing.  You have sudden pain in your chest.  You have chills or a fever. Summary  The prostate is a male gland that helps make semen. Prostate cancer is when abnormal cells grow in this gland.  Ask your doctor for help to find a support group for men with prostate cancer.  Contact a doctor if you have problems peeing or have any new pain that you did not have before. This information is not intended to replace advice given to you by your health care provider. Make sure you discuss any questions you have with your health care provider. Document Revised: 05/15/2017 Document Reviewed: 02/11/2016 Elsevier Patient Education  2020 Elsevier Inc.  

## 2019-08-19 NOTE — Progress Notes (Signed)
08/19/2019 3:10 PM   Joseph Gentry Feb 12, 1951 [id number]  Referring provider: Rosita Fire, MD [place],   [id number]  Prostate Cancer  HPI: Joseph Gentry is a 69yo here for followup for prostate cancer. He finished IMRT in 05/2019. His last ADT was in 02/2019. No hot flashes. Mild LUTS on flomax 0.4mg  daily. No bone pain. He started ADT in 12/2018.  His records from [facility] are as follows: He was found to have an elevated PSA of 79.4 that remained elevated at 84.2 when rechecked. He is healthy with a PMH significant for GERD, asthma, and PUD. He underwent a TRUS/Bx on 2.19.20 demonstrating GS 4+5 pattern with 10 out of 12 biopsy cores positive for malignancy.   Imaging studies:  CT abdomen and pelvis (09-14-2018): No evidence of lymphadenopathy or metastatic disease.  Bone scan: (14-Sep-2018): Enhancement of the posterior 10th rib  CT chest: His rib lesion appeared to be most consistent with posttraumatic changes rather than metastatic disease. This was also consistent with his clinical history of a prior rib fracture.   PMH: He has a history of GERD, asthma, and PUD. He also has a history of alcohol abuse and tobacco use.  PSH: No abdominal surgeries.   TNM stage: cT1c N0 M0-1  PSA: 84.2  Gleason score: 4+5=9  Biopsy (2.19.20): 10/12 cores positive  Left: L lateral apex (90%, 3+4=7), L apex (90%, 4+3=7), L lateral mid (90%, 4+3=7), L lateral base (10%, 4+3=7), L base (10%, 4+3=7)  Right: R apex (90%, 4+5=9), R lateral apex (90%, 4+3=7), R mid (20%, 4+3=7), R lateral mid (50%, 4+4=8), R lateral base (40%, 4+3=7)  Prostate volume: 22.2 cc   Nomogram  OC disease: 1%  EPE: 99%  SVI: 94%  LNI: 92%  PFS (5 year, 10 year): 9%, 5%   He chose LT ADT and EBRT   7.28.2020--ADT initiated with 240 mg Firmagon   9.1.2020: He is here for a 4 month Lupron. He will be initiating external beam radiotherapy in the [place] area, with him staying with his daughter, in the near  future. He is not having any hot flashes after his [place], and denies any significant injection site difficulties    PMH: Past Medical History:  Diagnosis Date  . GERD (gastroesophageal reflux disease)   . Prostate cancer [facility])     Surgical History: Past Surgical History:  Procedure Laterality Date  . NOSE SURGERY      Home Medications:  Allergies as of 08/19/2019   No Known Allergies     Medication List       Accurate as of August 19, 2019  3:10 PM. If you have any questions, ask your nurse or doctor.        STOP taking these medications   levofloxacin 750 MG tablet Commonly known as: LEVAQUIN Stopped by: Nicolette Bang, MD   Ventolin HFA 108 (90 Base) MCG/ACT inhaler Generic drug: albuterol Stopped by: Nicolette Bang, MD     TAKE these medications   FeroSul 325 (65 FE) MG tablet Generic drug: ferrous sulfate Take 325 mg by mouth daily.   mirtazapine 15 MG tablet Commonly known as: REMERON Take 15 mg by mouth at bedtime.   multivitamin with minerals Tabs tablet Take 1 tablet by mouth daily.   omeprazole 40 MG capsule Commonly known as: PRILOSEC Take 40 mg by mouth daily.   pantoprazole 40 MG tablet Commonly known as: PROTONIX Take 40 mg by mouth daily.   Spiriva Respimat 1.25 MCG/ACT  Aers Generic drug: Tiotropium Bromide Monohydrate Inhale 1 puff into the lungs daily. What changed: Another medication with the same name was removed. Continue taking this medication, and follow the directions you see here. Changed by: Nicolette Bang, MD   tamsulosin 0.4 MG Caps capsule Commonly known as: FLOMAX Take 0.4 mg by mouth daily.   traMADol 50 MG tablet Commonly known as: ULTRAM Take 1 tablet (50 mg total) by mouth every 6 (six) hours as needed.       Allergies: No Known Allergies  Family History: Family History  Problem Relation Age of Onset  . Prostate cancer Neg Hx     Social History:  reports that he has been smoking cigarettes. He has  been smoking about 1.00 pack per day. He has never used smokeless tobacco. He reports current alcohol use of about 6.0 standard drinks of alcohol per week. He reports previous drug use. Drugs: Cocaine and Marijuana.  ROS: All other review of systems were reviewed and are negative except what is noted above in HPI  Physical Exam: BP 119/81   Pulse (!) 108   Temp (!) 96.7 F (35.9 C)   Ht 5' 8.5" (1.74 m)   Wt 149 lb (67.6 kg)   BMI 22.33 kg/m   Constitutional:  Alert and oriented, No acute distress. HEENT: [place] AT, moist mucus membranes.  Trachea midline, no masses. Cardiovascular: No clubbing, cyanosis, or edema. Respiratory: Normal respiratory effort, no increased work of breathing. GI: Abdomen is soft, nontender, nondistended, no abdominal masses GU: No CVA tenderness Lymph: No cervical or inguinal lymphadenopathy. Skin: No rashes, bruises or suspicious lesions. Neurologic: Grossly intact, no focal deficits, moving all 4 extremities. Psychiatric: Normal mood and affect.  Laboratory Data: Lab Results  Component Value Date   WBC 6.4 12/18/2015   HGB 15.6 12/18/2015   HCT 46.0 12/18/2015   MCV 100.3 (H) 12/18/2015   PLT 229 12/18/2015    Lab Results  Component Value Date   CREATININE 1.00 12/18/2015    No results found for: PSA  No results found for: TESTOSTERONE  No results found for: HGBA1C  Urinalysis No results found for: COLORURINE, APPEARANCEUR, LABSPEC, PHURINE, GLUCOSEU, HGBUR, BILIRUBINUR, KETONESUR, PROTEINUR, UROBILINOGEN, NITRITE, LEUKOCYTESUR  No results found for: LABMICR, [place], RBCUA, LABEPIT, MUCUS, BACTERIA  Pertinent Imaging:  No results found for this or any previous visit. No results found for this or any previous visit. No results found for this or any previous visit. No results found for this or any previous visit. No results found for this or any previous visit. No results found for this or any previous visit. No results found for this  or any previous visit. No results found for this or any previous visit.  Assessment & Plan:    1. Prostate cancer ([place]) -Lupron 45mg  today -PSA today, will call with results -RTC 6 months with PSA and for lupron 45mg    2. BPH with LUTS, weak stream: -continue flomax 0.4mg  daily   Return in about 6 months (around 02/19/2020) for PSA Lupron.  Nicolette Bang, MD  [facility] [place]

## 2019-08-19 NOTE — Progress Notes (Signed)

## 2019-08-20 LAB — TESTOSTERONE: Testosterone: 54 ng/dL — ABNORMAL LOW (ref 250–827)

## 2019-08-20 LAB — PSA: PSA: <0.1 ng/mL (ref <=4.0)

## 2019-08-22 ENCOUNTER — Ambulatory Visit (HOSPITAL_COMMUNITY): Payer: Medicare Other

## 2019-08-23 NOTE — Progress Notes (Signed)
Labs mailed

## 2019-09-06 ENCOUNTER — Ambulatory Visit: Payer: Medicare Other | Attending: Internal Medicine

## 2019-09-06 DIAGNOSIS — Z23 Encounter for immunization: Secondary | ICD-10-CM

## 2019-09-06 NOTE — Progress Notes (Signed)
   Covid-19 Vaccination Clinic  Name:  Joseph Gentry    MRN: [id number] DOB: November 21, 1950  09/06/2019  [name] was observed post Covid-19 immunization for 15 minutes without incident. He was provided with Vaccine Information Sheet and instruction to access the V-Safe system.   [name] was instructed to call 911 with any severe reactions post vaccine: Marland Kitchen Difficulty breathing  . Swelling of face and throat  . A fast heartbeat  . A bad rash all over body  . Dizziness and weakness   Immunizations Administered    Name Date Dose VIS Date Route   Pfizer COVID-19 Vaccine 09/06/2019  2:55 PM 0.3 mL 05/27/2019 Intramuscular   Manufacturer: [place]   Lot: [id number]   [place]: [id number]

## 2019-09-09 DIAGNOSIS — M7501 Adhesive capsulitis of right shoulder: Secondary | ICD-10-CM | POA: Diagnosis not present

## 2019-09-09 DIAGNOSIS — M7502 Adhesive capsulitis of left shoulder: Secondary | ICD-10-CM | POA: Diagnosis not present

## 2019-09-19 DIAGNOSIS — R293 Abnormal posture: Secondary | ICD-10-CM | POA: Diagnosis not present

## 2019-09-19 DIAGNOSIS — M7500 Adhesive capsulitis of unspecified shoulder: Secondary | ICD-10-CM | POA: Diagnosis not present

## 2019-09-19 DIAGNOSIS — M25611 Stiffness of right shoulder, not elsewhere classified: Secondary | ICD-10-CM | POA: Diagnosis not present

## 2019-09-19 DIAGNOSIS — M25612 Stiffness of left shoulder, not elsewhere classified: Secondary | ICD-10-CM | POA: Diagnosis not present

## 2019-09-28 ENCOUNTER — Ambulatory Visit (HOSPITAL_COMMUNITY): Payer: Medicare Other

## 2019-10-05 DIAGNOSIS — M7500 Adhesive capsulitis of unspecified shoulder: Secondary | ICD-10-CM | POA: Diagnosis not present

## 2019-10-05 DIAGNOSIS — M25612 Stiffness of left shoulder, not elsewhere classified: Secondary | ICD-10-CM | POA: Diagnosis not present

## 2019-10-05 DIAGNOSIS — M25611 Stiffness of right shoulder, not elsewhere classified: Secondary | ICD-10-CM | POA: Diagnosis not present

## 2019-10-05 DIAGNOSIS — R293 Abnormal posture: Secondary | ICD-10-CM | POA: Diagnosis not present

## 2019-10-12 DIAGNOSIS — M7500 Adhesive capsulitis of unspecified shoulder: Secondary | ICD-10-CM | POA: Diagnosis not present

## 2019-10-12 DIAGNOSIS — M25611 Stiffness of right shoulder, not elsewhere classified: Secondary | ICD-10-CM | POA: Diagnosis not present

## 2019-10-12 DIAGNOSIS — M25612 Stiffness of left shoulder, not elsewhere classified: Secondary | ICD-10-CM | POA: Diagnosis not present

## 2019-10-12 DIAGNOSIS — R293 Abnormal posture: Secondary | ICD-10-CM | POA: Diagnosis not present

## 2019-10-20 ENCOUNTER — Other Ambulatory Visit: Payer: Self-pay

## 2019-10-20 ENCOUNTER — Other Ambulatory Visit (HOSPITAL_COMMUNITY)
Admission: RE | Admit: 2019-10-20 | Discharge: 2019-10-20 | Disposition: A | Discharge status: Home / Self Care | Payer: Medicare Other | Source: Ambulatory Visit | Attending: Critical Care Medicine | Admitting: Critical Care Medicine

## 2019-10-20 ENCOUNTER — Other Ambulatory Visit (HOSPITAL_COMMUNITY): Payer: Medicare Other

## 2019-10-20 DIAGNOSIS — Z20822 Contact with and (suspected) exposure to covid-19: Secondary | ICD-10-CM | POA: Insufficient documentation

## 2019-10-20 DIAGNOSIS — Z01812 Encounter for preprocedural laboratory examination: Secondary | ICD-10-CM | POA: Diagnosis not present

## 2019-10-20 LAB — SARS CORONAVIRUS 2 (TAT 6-24 HRS): SARS Coronavirus 2: NEGATIVE

## 2019-10-24 ENCOUNTER — Other Ambulatory Visit: Payer: Self-pay

## 2019-10-24 ENCOUNTER — Ambulatory Visit (INDEPENDENT_AMBULATORY_CARE_PROVIDER_SITE_OTHER): Payer: Medicare Other | Admitting: Critical Care Medicine

## 2019-10-24 DIAGNOSIS — Z720 Tobacco use: Secondary | ICD-10-CM | POA: Diagnosis not present

## 2019-10-24 DIAGNOSIS — R911 Solitary pulmonary nodule: Secondary | ICD-10-CM

## 2019-10-24 LAB — PULMONARY FUNCTION TEST
DL/VA % pred: 46 %
DL/VA: 1.87 ml/min/mmHg/L
DLCO unc % pred: 46 %
DLCO unc: 11.16 ml/min/mmHg
FEF 25-75 Post: 0.91 L/sec
FEF 25-75 Pre: 0.89 L/sec
FEF2575-%Change-Post: 2 %
FEF2575-%Pred-Post: 40 %
FEF2575-%Pred-Pre: 39 %
FEV1-%Change-Post: 1 %
FEV1-%Pred-Post: 79 %
FEV1-%Pred-Pre: 78 %
FEV1-Post: 2.11 L
FEV1-Pre: 2.08 L
FEV1FVC-%Change-Post: 1 %
FEV1FVC-%Pred-Pre: 73 %
FEV6-%Change-Post: 0 %
FEV6-%Pred-Post: 106 %
FEV6-%Pred-Pre: 106 %
FEV6-Post: 3.56 L
FEV6-Pre: 3.57 L
FEV6FVC-%Change-Post: 0 %
FEV6FVC-%Pred-Post: 101 %
FEV6FVC-%Pred-Pre: 102 %
FVC-%Change-Post: 0 %
FVC-%Pred-Post: 104 %
FVC-%Pred-Pre: 104 %
FVC-Post: 3.69 L
FVC-Pre: 3.68 L
Post FEV1/FVC ratio: 57 %
Post FEV6/FVC ratio: 97 %
Pre FEV1/FVC ratio: 56 %
Pre FEV6/FVC Ratio: 97 %
RV % pred: 98 %
RV: 2.32 L
TLC % pred: 96 %
TLC: 6.36 L

## 2019-10-24 NOTE — Progress Notes (Signed)
PFT completed today.  

## 2019-10-26 ENCOUNTER — Other Ambulatory Visit: Payer: Self-pay

## 2019-10-26 ENCOUNTER — Encounter: Payer: Self-pay | Admitting: Critical Care Medicine

## 2019-10-26 ENCOUNTER — Ambulatory Visit (INDEPENDENT_AMBULATORY_CARE_PROVIDER_SITE_OTHER): Payer: Medicare Other | Admitting: Critical Care Medicine

## 2019-10-26 DIAGNOSIS — Z720 Tobacco use: Secondary | ICD-10-CM | POA: Diagnosis not present

## 2019-10-26 DIAGNOSIS — J439 Emphysema, unspecified: Secondary | ICD-10-CM

## 2019-10-26 MED ORDER — CETIRIZINE HCL 10 MG PO TABS: 10.0000 mg | ORAL_TABLET | Freq: Every day | ORAL | 11 refills | Status: AC

## 2019-10-26 MED ORDER — FLUTICASONE PROPIONATE 50 MCG/ACT NA SUSP: 2.0000 | Freq: Every day | NASAL | 11 refills | Status: AC

## 2019-10-26 MED ORDER — STIOLTO RESPIMAT 2.5-2.5 MCG/ACT IN AERS
2.0000 | INHALATION_SPRAY | Freq: Every day | RESPIRATORY_TRACT | 11 refills | Status: DC
Start: 2019-10-26 — End: 2021-08-19

## 2019-10-26 NOTE — Patient Instructions (Addendum)
Thank you for visiting Dr. Carlis Abbott at [facility]. We recommend the following:  StopS piriva once starting Stiolto, your new inhaler.   Meds ordered this encounter  Medications  . Tiotropium Bromide-Olodaterol (STIOLTO RESPIMAT) 2.5-2.5 MCG/ACT AERS    Sig: Inhale 2 puffs into the lungs daily.    Dispense:  4 g    Refill:  11  . fluticasone (FLONASE) 50 MCG/ACT nasal spray    Sig: Place 2 sprays into both nostrils daily.    Dispense:  16 g    Refill:  11  . cetirizine (ZYRTEC) 10 MG tablet    Sig: Take 1 tablet (10 mg total) by mouth daily.    Dispense:  30 tablet    Refill:  11    Return in about 2 months (around 12/26/2019).    Please do your part to reduce the spread of COVID-19.

## 2019-10-26 NOTE — Progress Notes (Signed)
Synopsis: Referred in February 2021 for SOB by Rosita Fire, MD.  Subjective:   PATIENT ID: Joseph Gentry GENDER: male DOB: 1950-12-28, MRN: [id number]  Chief Complaint  Patient presents with  . Televisit    no c/o cough/ SOB at night with activitiy    Virtual Visit via Video Note  I connected with Joseph Gentry on 10/26/19 at  1:45 PM EDT by a video enabled telemedicine application and verified that I am speaking with the correct person using two identifiers.  Location: Patient: home Provider: [facility]-  [address], [place]    I discussed the limitations of evaluation and management by telemedicine and the availability of in person appointments. The patient expressed understanding and agreed to proceed.  I discussed the assessment and treatment plan with the patient. The patient was provided an opportunity to ask questions and all were answered. The patient agreed with the plan and demonstrated an understanding of the instructions.   The patient was advised to call back or seek an in-person evaluation if the symptoms worsen or if the condition fails to improve as anticipated.  I provided 24 minutes of non-face-to-face time during this encounter.   Mr. Simic is a 69 year old gentleman who presents for follow-up.  His daughter Levada Dy is also participating in virtual visit.  Since starting Spiriva his breathing is significantly improved.  He has been using his albuterol more recently since his allergy symptoms been worse.  He still has some dyspnea on exertion and some shortness of breath at night.  He continues to smoke, but is working on cutting back some.  He coughs infrequently throughout the day with some sputum production.  No longer wheezing.  He has been having more allergy symptoms, which is improved with Zyrtec and Flonase.  He continues to Lupron for his prostate cancer and has completed radiation.    OV 08/08/19: Mr. Stirling is a 69 year old  woman who presents for evaluation of shortness of breath.  He is accompanied by his daughter for his visit, whom he lives with.  He was previously seen by Dr. Luan Pulling, but due to Covid has not had a full work-up.  He began having shortness of breath, mostly with exertion or associated with position changes in the spring 2020.  He is on a Ventolin inhaler which he uses twice daily in the morning and at night, sometimes a third time per day due to wheezing.  He has improvement in his symptoms with albuterol use.  He has a cough with chronic sputum production, which is increased as he has been cutting back on smoking cigarettes.  He has never received antibiotics or prednisone for respiratory infections.  At his heaviest he smoked half pack per day, but has recently cut down to a few cigarettes a day, about 1 pack every 2 weeks.  He started smoking 55 years ago around age 67.  He is up-to-date on his seasonal flu and one pneumonia shot, and is on the waiting list for Covid vaccine.  No previous history of lung disease or family history of lung disease.    Past Medical History:  Diagnosis Date  . GERD (gastroesophageal reflux disease)   . Prostate cancer [facility])      Family History  Problem Relation Age of Onset  . Prostate cancer Neg Hx    Unknown family medical history.  Past Surgical History:  Procedure Laterality Date  . NOSE SURGERY      Social History  Socioeconomic History  . Marital status: Divorced    Spouse name: Not on file  . Number of children: 2  . Years of education: Not on file  . Highest education level: Not on file  Occupational History  . Occupation: retired    Comment: Equities trader  Tobacco Use  . Smoking status: Current Every Day Smoker    Packs/day: 1.00    Types: Cigarettes  . Smokeless tobacco: Never Used  . Tobacco comment: 5-10 cigsarettes a day 10/26/19 [facility]  Substance and Sexual Activity  . Alcohol use: Yes    Alcohol/week: 6.0 standard drinks    Types: 6  Cans of beer per week    Comment: daily  . Drug use: Not Currently    Types: Cocaine, Marijuana  . Sexual activity: Not Currently  Other Topics Concern  . Not on file  Social History Narrative  . Not on file   Social Determinants of Health   Financial Resource Strain:   . Difficulty of Paying Living Expenses:   Food Insecurity:   . Worried About Charity fundraiser in the Last Year:   . Arboriculturist in the Last Year:   Transportation Needs:   . Film/video editor (Medical):   Marland Kitchen Lack of Transportation (Non-Medical):   Physical Activity:   . Days of Exercise per Week:   . Minutes of Exercise per Session:   Stress:   . Feeling of Stress :   Social Connections:   . Frequency of Communication with Friends and Family:   . Frequency of Social Gatherings with Friends and Family:   . Attends Religious Services:   . Active Member of Clubs or Organizations:   . Attends Archivist Meetings:   Marland Kitchen Marital Status:   Intimate Partner Violence:   . Fear of Current or Ex-Partner:   . Emotionally Abused:   Marland Kitchen Physically Abused:   . Sexually Abused:      No Known Allergies   Immunization History  Administered Date(s) Administered  . DTaP 10/10/2011  . Influenza,inj,Quad PF,6-35 Mos 05/08/2019  . PFIZER SARS-COV-2 Vaccination 08/12/2019, 09/06/2019    Outpatient Medications Prior to Visit  Medication Sig Dispense Refill  . FEROSUL 325 (65 Fe) MG tablet Take 325 mg by mouth daily.    . mirtazapine (REMERON) 15 MG tablet Take 15 mg by mouth at bedtime.    . Multiple Vitamin (MULTIVITAMIN WITH MINERALS) TABS tablet Take 1 tablet by mouth daily.    Marland Kitchen omeprazole (PRILOSEC) 40 MG capsule Take 40 mg by mouth daily.    . pantoprazole (PROTONIX) 40 MG tablet Take 40 mg by mouth daily.    . tamsulosin (FLOMAX) 0.4 MG CAPS capsule Take 1 capsule (0.4 mg total) by mouth daily. 90 capsule 3  . Tiotropium Bromide Monohydrate (SPIRIVA RESPIMAT) 1.25 MCG/ACT AERS Inhale 1 puff into  the lungs daily. 4 g 11  . traMADol (ULTRAM) 50 MG tablet Take 1 tablet (50 mg total) by mouth every 6 (six) hours as needed. 15 tablet 0   No facility-administered medications prior to visit.    Review of Systems  Constitutional:       Regaining weight after previous weight loss attributable to cancer  Respiratory: Positive for cough, sputum production, shortness of breath and wheezing.   Cardiovascular: Negative for leg swelling.  Musculoskeletal:       Bilateral shoulder pain, some hand stiffness bilaterally     Objective:   There were no vitals filed for  this visit.   on   RA BMI Readings from Last 3 Encounters:  08/19/19 22.33 kg/m  08/08/19 22.29 kg/m  03/30/18 17.92 kg/m   Wt Readings from Last 3 Encounters:  08/19/19 149 lb (67.6 kg)  08/08/19 146 lb 9.6 oz (66.5 kg)  03/30/18 119 lb 9.6 oz (54.3 kg)    Physical Exam-limited due to televisit.  No conversational dyspnea.  Answering questions appropriately.   CBC    Component Value Date/Time   WBC 6.4 12/18/2015 1745   RBC 3.99 (L) 12/18/2015 1745   HGB 15.6 12/18/2015 1756   HCT 46.0 12/18/2015 1756   PLT 229 12/18/2015 1745   MCV 100.3 (H) 12/18/2015 1745   MCH 35.1 (H) 12/18/2015 1745   MCHC 35.0 12/18/2015 1745   RDW 13.8 12/18/2015 1745   LYMPHSABS 1.7 12/18/2015 1745   MONOABS 0.6 12/18/2015 1745   EOSABS 0.4 12/18/2015 1745   BASOSABS 0.1 12/18/2015 1745    CHEMISTRY No results for input(s): NA, K, CL, CO2, GLUCOSE, BUN, CREATININE, CALCIUM, MG, PHOS in the last 168 hours. CrCl cannot be calculated (Patient's most recent lab result is older than the maximum 21 days allowed.).   Chest Imaging- films reviewed: CXR, 2 view 08/23/2018-hyperinflated with flattened hemidiaphragms.  Small convexity and AP window, possibly lymph node.  Pulmonary Functions Testing Results: PFT Results Latest Ref Rng & Units 10/24/2019  FVC-Pre L 3.68  FVC-Predicted Pre % 104  FVC-Post L 3.69  FVC-Predicted Post %  104  Pre FEV1/FVC % % 56  Post FEV1/FCV % % 57  FEV1-Pre L 2.08  FEV1-Predicted Pre % 78  FEV1-Post L 2.11  DLCO UNC% % 46  DLCO COR %Predicted % 46  TLC L 6.36  TLC % Predicted % 96  RV % Predicted % 98   2021- moderate obstruction without significant bronchodilator reversibility.  No air trapping or hyperinflation.  Moderately impaired diffusion.     Assessment & Plan:     ICD-10-CM   1. Tobacco abuse  Z72.0   2. Pulmonary emphysema, unspecified emphysema type ([place])  J43.9     COPD -Start Stiolto once daily.  Discontinue Spiriva once starting this. -Continue albuterol as needed -Recommend smoking cessation -Up-to-date on seasonal flu and Covid vaccines. -Encouraged his daughter to verify that his blood counts have been monitored during his cancer treatment.  If he has anemia, this could be contributing to dyspnea.  Allergic rhinitis -Okay to continue Flonase and Zyrtec daily during allergy season.  Prescriptions for these medications were written per the daughter's request.  Not discussed today: Abnormal chest x-ray, history of tobacco abuse -CT chest ordered but not completed  Tobacco abuse -Recommend total smoking cessation.  Discussed methods that may help him, including nicotine replacement therapy.  He is working on quitting entirely.  Stress is the main barrier.   RTC in 2 months after PFTs.   Current Outpatient Medications:  .  FEROSUL 325 (65 Fe) MG tablet, Take 325 mg by mouth daily., Disp: , Rfl:  .  mirtazapine (REMERON) 15 MG tablet, Take 15 mg by mouth at bedtime., Disp: , Rfl:  .  Multiple Vitamin (MULTIVITAMIN WITH MINERALS) TABS tablet, Take 1 tablet by mouth daily., Disp: , Rfl:  .  omeprazole (PRILOSEC) 40 MG capsule, Take 40 mg by mouth daily., Disp: , Rfl:  .  pantoprazole (PROTONIX) 40 MG tablet, Take 40 mg by mouth daily., Disp: , Rfl:  .  tamsulosin (FLOMAX) 0.4 MG CAPS capsule, Take 1 capsule (0.4  mg total) by mouth daily., Disp: 90 capsule,  Rfl: 3 .  Tiotropium Bromide Monohydrate (SPIRIVA RESPIMAT) 1.25 MCG/ACT AERS, Inhale 1 puff into the lungs daily., Disp: 4 g, Rfl: 11 .  traMADol (ULTRAM) 50 MG tablet, Take 1 tablet (50 mg total) by mouth every 6 (six) hours as needed., Disp: 15 tablet, Rfl: 0 .  cetirizine (ZYRTEC) 10 MG tablet, Take 1 tablet (10 mg total) by mouth daily., Disp: 30 tablet, Rfl: 11 .  fluticasone (FLONASE) 50 MCG/ACT nasal spray, Place 2 sprays into both nostrils daily., Disp: 16 g, Rfl: 11 .  Tiotropium Bromide-Olodaterol (STIOLTO RESPIMAT) 2.5-2.5 MCG/ACT AERS, Inhale 2 puffs into the lungs daily., Disp: 4 g, Rfl: Mims Leonarda Leis, DO [facility] 10/26/2019 2:12 PM

## 2019-12-28 ENCOUNTER — Ambulatory Visit (INDEPENDENT_AMBULATORY_CARE_PROVIDER_SITE_OTHER): Payer: Medicare Other | Admitting: Critical Care Medicine

## 2019-12-28 ENCOUNTER — Encounter: Payer: Self-pay | Admitting: Critical Care Medicine

## 2019-12-28 ENCOUNTER — Other Ambulatory Visit: Payer: Self-pay

## 2019-12-28 VITALS — BP 122/72 | HR 94 | Temp 98.4°F | Ht 68.0 in | Wt 150.8 lb

## 2019-12-28 DIAGNOSIS — R0600 Dyspnea, unspecified: Secondary | ICD-10-CM

## 2019-12-28 DIAGNOSIS — Z720 Tobacco use: Secondary | ICD-10-CM

## 2019-12-28 DIAGNOSIS — R0609 Other forms of dyspnea: Secondary | ICD-10-CM

## 2019-12-28 DIAGNOSIS — J449 Chronic obstructive pulmonary disease, unspecified: Secondary | ICD-10-CM

## 2019-12-28 MED ORDER — ALBUTEROL SULFATE HFA 108 (90 BASE) MCG/ACT IN AERS
2.0000 | INHALATION_SPRAY | RESPIRATORY_TRACT | 11 refills | Status: DC | PRN
Start: 2019-12-28 — End: 2021-11-20

## 2019-12-28 NOTE — Progress Notes (Signed)
Synopsis: Referred in February 2021 for SOB by Rosita Fire, MD.  Subjective:   PATIENT ID: Joseph Gentry GENDER: male DOB: [birth year], MRN: [id number]  Chief Complaint  Patient presents with  . Follow-up    SOB better      Joseph Gentry is a 69 year old gentleman with a history of COPD.  He is accompanied today by his daughter.  At his last visit he was switched from [place] to [employer].  Since then he has noticed a significant improvement in his breathing.  He is able to do more before he has to stop.  He has less wheezing, coughing, and uses his albuterol much less frequently.  He seldom needs it.  He has occasional minimal sputum production, which is his baseline.  He has not had an acute exacerbation since his last visit.  He is up-to-date on his Covid and flu shots and thinks he has had one pneumonia shot at his local pharmacy.  He is unsure which.  He continues to smoke 0.5 packs/day, sometimes less.  He tends to notice that he smokes more when he drinks, but he smokes throughout the day every day.  Not sure if he has any plans to quit right now.    OV 10/26/19: Joseph Gentry is a 69 year old gentleman who presents for follow-up.  His daughter Levada Dy is also participating in virtual visit.  Since starting Spiriva his breathing is significantly improved.  He has been using his albuterol more recently since his allergy symptoms been worse.  He still has some dyspnea on exertion and some shortness of breath at night.  He continues to smoke, but is working on cutting back some.  He coughs infrequently throughout the day with some sputum production.  No longer wheezing.  He has been having more allergy symptoms, which is improved with Zyrtec and Flonase.  He continues to Lupron for his prostate cancer and has completed radiation.   OV 08/08/19: Joseph Gentry is a 69 year old woman who presents for evaluation of shortness of breath.  He is accompanied by his daughter for his visit, whom he lives  with.  He was previously seen by Dr. Luan Pulling, but due to Covid has not had a full work-up.  He began having shortness of breath, mostly with exertion or associated with position changes in the spring 2020.  He is on a Ventolin inhaler which he uses twice daily in the morning and at night, sometimes a third time per day due to wheezing.  He has improvement in his symptoms with albuterol use.  He has a cough with chronic sputum production, which is increased as he has been cutting back on smoking cigarettes.  He has never received antibiotics or prednisone for respiratory infections.  At his heaviest he smoked half pack per day, but has recently cut down to a few cigarettes a day, about 1 pack every 2 weeks.  He started smoking 55 years ago around age 63.  He is up-to-date on his seasonal flu and one pneumonia shot, and is on the waiting list for Covid vaccine.  No previous history of lung disease or family history of lung disease.    Past Medical History:  Diagnosis Date  . GERD (gastroesophageal reflux disease)   . Prostate cancer [facility])      Family History  Problem Relation Age of Onset  . Prostate cancer Neg Hx    Unknown family medical history.  Past Surgical History:  Procedure Laterality Date  . NOSE SURGERY  Social History   Socioeconomic History  . Marital status: Divorced    Spouse name: Not on file  . Number of children: 2  . Years of education: Not on file  . Highest education level: Not on file  Occupational History  . Occupation: retired    Comment: Equities trader  Tobacco Use  . Smoking status: Current Every Day Smoker    Packs/day: 1.00    Types: Cigarettes  . Smokeless tobacco: Never Used  . Tobacco comment: 5-10 cigsarettes a day 12/28/19 [facility]  Vaping Use  . Vaping Use: Never used  Substance and Sexual Activity  . Alcohol use: Yes    Alcohol/week: 6.0 standard drinks    Types: 6 Cans of beer per week    Comment: daily  . Drug use: Not Currently    Types:  Cocaine, Marijuana  . Sexual activity: Not Currently  Other Topics Concern  . Not on file  Social History Narrative  . Not on file   Social Determinants of Health   Financial Resource Strain:   . Difficulty of Paying Living Expenses:   Food Insecurity:   . Worried About Charity fundraiser in the Last Year:   . Arboriculturist in the Last Year:   Transportation Needs:   . Film/video editor (Medical):   Marland Kitchen Lack of Transportation (Non-Medical):   Physical Activity:   . Days of Exercise per Week:   . Minutes of Exercise per Session:   Stress:   . Feeling of Stress :   Social Connections:   . Frequency of Communication with Friends and Family:   . Frequency of Social Gatherings with Friends and Family:   . Attends Religious Services:   . Active Member of Clubs or Organizations:   . Attends Archivist Meetings:   Marland Kitchen Marital Status:   Intimate Partner Violence:   . Fear of Current or Ex-Partner:   . Emotionally Abused:   Marland Kitchen Physically Abused:   . Sexually Abused:      No Known Allergies   Immunization History  Administered Date(s) Administered  . DTaP 10/10/2011  . Influenza,inj,Quad PF,6-35 Mos 05/08/2019  . PFIZER SARS-COV-2 Vaccination 08/12/2019, 09/06/2019    Outpatient Medications Prior to Visit  Medication Sig Dispense Refill  . cetirizine (ZYRTEC) 10 MG tablet Take 1 tablet (10 mg total) by mouth daily. 30 tablet 11  . FEROSUL 325 (65 Fe) MG tablet Take 325 mg by mouth daily.    . fluticasone (FLONASE) 50 MCG/ACT nasal spray Place 2 sprays into both nostrils daily. 16 g 11  . mirtazapine (REMERON) 15 MG tablet Take 15 mg by mouth at bedtime.    . Multiple Vitamin (MULTIVITAMIN WITH MINERALS) TABS tablet Take 1 tablet by mouth daily.    Marland Kitchen omeprazole (PRILOSEC) 40 MG capsule Take 40 mg by mouth daily.    . pantoprazole (PROTONIX) 40 MG tablet Take 40 mg by mouth daily.    . Tiotropium Bromide-Olodaterol (STIOLTO RESPIMAT) 2.5-2.5 MCG/ACT AERS Inhale 2  puffs into the lungs daily. 4 g 11  . traMADol (ULTRAM) 50 MG tablet Take 1 tablet (50 mg total) by mouth every 6 (six) hours as needed. 15 tablet 0  . tamsulosin (FLOMAX) 0.4 MG CAPS capsule Take 1 capsule (0.4 mg total) by mouth daily. (Patient not taking: Reported on 12/28/2019) 90 capsule 3  . Tiotropium Bromide Monohydrate (SPIRIVA RESPIMAT) 1.25 MCG/ACT AERS Inhale 1 puff into the lungs daily. 4 g 11   No  facility-administered medications prior to visit.    Review of Systems  Constitutional:       Regaining weight after previous weight loss attributable to cancer  Respiratory: Positive for cough, sputum production, shortness of breath and wheezing.   Cardiovascular: Negative for leg swelling.  Musculoskeletal:       Bilateral shoulder pain, some hand stiffness bilaterally     Objective:   Vitals:   12/28/19 1116  BP: 122/72  Pulse: 94  Temp: 98.4 F (36.9 C)  TempSrc: Oral  SpO2: 93%  Weight: 150 lb 12.8 oz (68.4 kg)  Height: 5\' 8"  (1.727 m)   93% on   RA BMI Readings from Last 3 Encounters:  12/28/19 22.93 kg/m  08/19/19 22.33 kg/m  08/08/19 22.29 kg/m   Wt Readings from Last 3 Encounters:  12/28/19 150 lb 12.8 oz (68.4 kg)  08/19/19 149 lb (67.6 kg)  08/08/19 146 lb 9.6 oz (66.5 kg)    Physical Exam Vitals reviewed.  Constitutional:      General: He is not in acute distress.    Appearance: He is not ill-appearing.  HENT:     Head: Normocephalic and atraumatic.  Eyes:     General: No scleral icterus. Cardiovascular:     Rate and Rhythm: Normal rate and regular rhythm.     Heart sounds: No murmur heard.   Pulmonary:     Comments: Breathing comfortably on room air, no conversational dyspnea.  Distant breath sounds, CTAB. Abdominal:     Palpations: Abdomen is soft.  Musculoskeletal:        General: No swelling or deformity.     Cervical back: Neck supple.  Lymphadenopathy:     Cervical: No cervical adenopathy.  Skin:    General: Skin is warm and  dry.     Findings: No rash.  Neurological:     General: No focal deficit present.     Mental Status: He is alert.     Coordination: Coordination normal.  Psychiatric:        Mood and Affect: Mood normal.        Behavior: Behavior normal.       CBC    Component Value Date/Time   WBC 6.4 12/18/2015 1745   RBC 3.99 (L) 12/18/2015 1745   HGB 15.6 12/18/2015 1756   HCT 46.0 12/18/2015 1756   PLT 229 12/18/2015 1745   MCV 100.3 (H) 12/18/2015 1745   MCH 35.1 (H) 12/18/2015 1745   MCHC 35.0 12/18/2015 1745   RDW 13.8 12/18/2015 1745   LYMPHSABS 1.7 12/18/2015 1745   MONOABS 0.6 12/18/2015 1745   EOSABS 0.4 12/18/2015 1745   BASOSABS 0.1 12/18/2015 1745    CHEMISTRY No results for input(s): NA, K, CL, CO2, GLUCOSE, BUN, CREATININE, CALCIUM, MG, PHOS in the last 168 hours. CrCl cannot be calculated (Patient's most recent lab result is older than the maximum 21 days allowed.).   Chest Imaging- films reviewed: CXR, 2 view 08/23/2018-hyperinflated with flattened hemidiaphragms.  Small convexity and AP window, possibly lymph node.  Pulmonary Functions Testing Results: PFT Results Latest Ref Rng & Units 10/24/2019  FVC-Pre L 3.68  FVC-Predicted Pre % 104  FVC-Post L 3.69  FVC-Predicted Post % 104  Pre FEV1/FVC % % 56  Post FEV1/FCV % % 57  FEV1-Pre L 2.08  FEV1-Predicted Pre % 78  FEV1-Post L 2.11  DLCO UNC% % 46  DLCO COR %Predicted % 46  TLC L 6.36  TLC % Predicted % 96  RV %  Predicted % 98   2021- moderate obstruction without significant bronchodilator reversibility.  No air trapping or hyperinflation.  Moderately impaired diffusion.     Assessment & Plan:     ICD-10-CM   1. Chronic obstructive pulmonary disease, unspecified COPD type ([place])  J44.9   2. Tobacco abuse  Z72.0   3. DOE (dyspnea on exertion)  R06.00     COPD -Continue Stiolto once daily -Continue albuterol every 4 hours as needed -Recommend smoking cessation or cutting back.  We discussed multiple  strategies to potentially help with this. -Up-to-date on seasonal flu and Covid vaccines.  Tobacco abuse -Recommend total smoking cessation.  Discussed methods that may help him, including nicotine replacement therapy. -Previously has not been interested in having chest imaging.  RTC in 6 months.   Current Outpatient Medications:  .  cetirizine (ZYRTEC) 10 MG tablet, Take 1 tablet (10 mg total) by mouth daily., Disp: 30 tablet, Rfl: 11 .  FEROSUL 325 (65 Fe) MG tablet, Take 325 mg by mouth daily., Disp: , Rfl:  .  fluticasone (FLONASE) 50 MCG/ACT nasal spray, Place 2 sprays into both nostrils daily., Disp: 16 g, Rfl: 11 .  mirtazapine (REMERON) 15 MG tablet, Take 15 mg by mouth at bedtime., Disp: , Rfl:  .  Multiple Vitamin (MULTIVITAMIN WITH MINERALS) TABS tablet, Take 1 tablet by mouth daily., Disp: , Rfl:  .  omeprazole (PRILOSEC) 40 MG capsule, Take 40 mg by mouth daily., Disp: , Rfl:  .  pantoprazole (PROTONIX) 40 MG tablet, Take 40 mg by mouth daily., Disp: , Rfl:  .  Tiotropium Bromide-Olodaterol (STIOLTO RESPIMAT) 2.5-2.5 MCG/ACT AERS, Inhale 2 puffs into the lungs daily., Disp: 4 g, Rfl: 11 .  traMADol (ULTRAM) 50 MG tablet, Take 1 tablet (50 mg total) by mouth every 6 (six) hours as needed., Disp: 15 tablet, Rfl: 0 .  tamsulosin (FLOMAX) 0.4 MG CAPS capsule, Take 1 capsule (0.4 mg total) by mouth daily. (Patient not taking: Reported on 12/28/2019), Disp: 90 capsule, Rfl: 3    Julian Hy, DO [facility] 12/28/2019 11:33 AM

## 2019-12-28 NOTE — Patient Instructions (Addendum)
Thank you for visiting Dr. Carlis Abbott at [facility]. We recommend the following:   Go back to your pharmacy to check if you have had one of your pneumonia shots and which one it was. You should get both since you have COPD.  You need a flu shot in the fall.  Stay on Stiolto once daily.  Meds ordered this encounter  Medications  . albuterol (VENTOLIN HFA) 108 (90 Base) MCG/ACT inhaler    Sig: Inhale 2 puffs into the lungs every 4 (four) hours as needed for wheezing or shortness of breath.    Dispense:  18 g    Refill:  11    Return in about 6 months (around 06/29/2020).    Please do your part to reduce the spread of COVID-19.   It is very important that you stop smoking or vaping. This is the single most important thing that you can do to improve your lung health.   S = Set a quit date. T = Tell family, friends, and the people around you that you plan to quit. A = Anticipate or plan ahead for the tough times you'll face while quitting. R = Remove cigarettes and other tobacco products from your home, car, and work. T = Talk to [place] about getting help to quit.  If you need help, please reach out to our office or the smoking cessation resources available: [place] Smoking Cessation Class: [phone] 1-800-QUIT-NOW www.BeTobaccoFree.gov

## 2020-01-20 DIAGNOSIS — F17208 Nicotine dependence, unspecified, with other nicotine-induced disorders: Secondary | ICD-10-CM | POA: Diagnosis not present

## 2020-01-20 DIAGNOSIS — F17210 Nicotine dependence, cigarettes, uncomplicated: Secondary | ICD-10-CM | POA: Diagnosis not present

## 2020-01-20 DIAGNOSIS — J449 Chronic obstructive pulmonary disease, unspecified: Secondary | ICD-10-CM | POA: Diagnosis not present

## 2020-01-20 DIAGNOSIS — M1990 Unspecified osteoarthritis, unspecified site: Secondary | ICD-10-CM | POA: Diagnosis not present

## 2020-02-20 DIAGNOSIS — J410 Simple chronic bronchitis: Secondary | ICD-10-CM | POA: Diagnosis not present

## 2020-02-20 DIAGNOSIS — M1990 Unspecified osteoarthritis, unspecified site: Secondary | ICD-10-CM | POA: Diagnosis not present

## 2020-02-22 ENCOUNTER — Ambulatory Visit: Payer: Medicare Other | Admitting: Urology

## 2020-03-21 DIAGNOSIS — M1990 Unspecified osteoarthritis, unspecified site: Secondary | ICD-10-CM | POA: Diagnosis not present

## 2020-04-09 DIAGNOSIS — Z23 Encounter for immunization: Secondary | ICD-10-CM | POA: Diagnosis not present

## 2020-04-21 DIAGNOSIS — M1990 Unspecified osteoarthritis, unspecified site: Secondary | ICD-10-CM | POA: Diagnosis not present

## 2020-04-27 DIAGNOSIS — J410 Simple chronic bronchitis: Secondary | ICD-10-CM | POA: Diagnosis not present

## 2020-04-27 DIAGNOSIS — F17208 Nicotine dependence, unspecified, with other nicotine-induced disorders: Secondary | ICD-10-CM | POA: Diagnosis not present

## 2020-04-27 DIAGNOSIS — M1990 Unspecified osteoarthritis, unspecified site: Secondary | ICD-10-CM | POA: Diagnosis not present

## 2020-06-04 DIAGNOSIS — M1990 Unspecified osteoarthritis, unspecified site: Secondary | ICD-10-CM | POA: Diagnosis not present

## 2020-07-25 DIAGNOSIS — M109 Gout, unspecified: Secondary | ICD-10-CM | POA: Diagnosis not present

## 2020-07-25 DIAGNOSIS — Z79899 Other long term (current) drug therapy: Secondary | ICD-10-CM | POA: Diagnosis not present

## 2020-07-25 DIAGNOSIS — Z1389 Encounter for screening for other disorder: Secondary | ICD-10-CM | POA: Diagnosis not present

## 2020-07-25 DIAGNOSIS — F17210 Nicotine dependence, cigarettes, uncomplicated: Secondary | ICD-10-CM | POA: Diagnosis not present

## 2020-07-25 DIAGNOSIS — Z0001 Encounter for general adult medical examination with abnormal findings: Secondary | ICD-10-CM | POA: Diagnosis not present

## 2020-07-25 DIAGNOSIS — J410 Simple chronic bronchitis: Secondary | ICD-10-CM | POA: Diagnosis not present

## 2020-08-03 ENCOUNTER — Encounter: Payer: Self-pay | Admitting: Internal Medicine

## 2020-08-20 ENCOUNTER — Other Ambulatory Visit: Payer: Self-pay

## 2020-08-20 ENCOUNTER — Encounter: Payer: Self-pay | Admitting: Gastroenterology

## 2020-08-20 ENCOUNTER — Ambulatory Visit (INDEPENDENT_AMBULATORY_CARE_PROVIDER_SITE_OTHER): Payer: Medicare Other | Admitting: Gastroenterology

## 2020-08-20 VITALS — BP 131/90 | HR 120 | Temp 96.6°F | Ht 68.0 in | Wt 152.6 lb

## 2020-08-20 DIAGNOSIS — R109 Unspecified abdominal pain: Secondary | ICD-10-CM | POA: Diagnosis not present

## 2020-08-20 DIAGNOSIS — R945 Abnormal results of liver function studies: Secondary | ICD-10-CM | POA: Insufficient documentation

## 2020-08-20 DIAGNOSIS — D509 Iron deficiency anemia, unspecified: Secondary | ICD-10-CM | POA: Diagnosis not present

## 2020-08-20 DIAGNOSIS — R7989 Other specified abnormal findings of blood chemistry: Secondary | ICD-10-CM | POA: Insufficient documentation

## 2020-08-20 DIAGNOSIS — D7589 Other specified diseases of blood and blood-forming organs: Secondary | ICD-10-CM | POA: Diagnosis not present

## 2020-08-20 DIAGNOSIS — F1020 Alcohol dependence, uncomplicated: Secondary | ICD-10-CM

## 2020-08-20 NOTE — Patient Instructions (Addendum)
1. Please go for labs and ultrasound.  At [facility] we value your feedback. You may receive a survey about your visit today. Please share your experience as we strive to create trusting relationships with our patients to provide genuine, compassionate, quality care.   We appreciate your understanding and patience as we review any laboratory studies, imaging, and other diagnostic tests that are ordered as we care for you. Our office policy is 5 business days for review of these results, and any emergent or urgent results are addressed in a timely manner for your best interest. If you do not hear from our office in 1 week, please contact us.    We also encourage the use of MyChart, which contains your medical information for your review as well. If you are not enrolled in this feature, an access code is on this after visit summary for your convenience. Thank you for allowing [place] to be involved in your care.

## 2020-08-20 NOTE — Progress Notes (Signed)
Primary Care Physician:  Rosita Fire, MD  Primary Gastroenterologist:  Elon Alas. Abbey Chatters, DO   Chief Complaint  Patient presents with   Elevated alkaline phosphate    HPI:  Joseph Gentry is a 70 y.o. male here at the request of Dr. Legrand Rams for elevated alkaline phosphatase.  It is not clear to me how long this finding has been has been present. Available labs are from February 2022 which shows normal LFTs except for alkaline phosphatase of 191, AST of 47.  Hepatitis B surface antibody, hepatitis B surface antigen, hepatitis C antibody all negative.  Has been on iron pills for about a year. No prior blood transfusion or iron infusion. Protein shake for year. Weight improved. Oral intake improved.  Has been living with his daughters for over a year now.  Although still drinking daily, has cut back to 6-7 beers daily.  He is chronically abused alcohol.  Denies abdominal pain.  No heartburn or vomiting since being on omeprazole.  Bowel movements regular.  Sometimes stools are dark on iron.  No melena or rectal bleeding.  Diagnosed with prostate cancer last year, completed external radiation.  Took 3 "hormone shots".  CT abdomen and pelvis in 2020 with normal-appearing liver.  Prostate findings consistent with history of prostate cancer.  Bone scan March 2020 with heterogeneous posterior rib activity most pronounced on the right at approximately 10th rib level, indeterminate, consider chest CT.  Current Outpatient Medications  Medication Sig Dispense Refill   albuterol (VENTOLIN HFA) 108 (90 Base) MCG/ACT inhaler Inhale 2 puffs into the lungs every 4 (four) hours as needed for wheezing or shortness of breath. 18 g 11   cetirizine (ZYRTEC) 10 MG tablet Take 1 tablet (10 mg total) by mouth daily. (Patient taking differently: Take 10 mg by mouth as needed.) 30 tablet 11   FEROSUL 325 (65 Fe) MG tablet Take 325 mg by mouth daily.     fluticasone (FLONASE) 50 MCG/ACT nasal spray Place 2 sprays  into both nostrils daily. (Patient taking differently: Place 2 sprays into both nostrils as needed.) 16 g 11   mirtazapine (REMERON) 15 MG tablet Take 15 mg by mouth at bedtime.     Multiple Vitamin (MULTIVITAMIN WITH MINERALS) TABS tablet Take 1 tablet by mouth daily.     omeprazole (PRILOSEC) 40 MG capsule Take 40 mg by mouth daily.     tamsulosin (FLOMAX) 0.4 MG CAPS capsule Take 1 capsule (0.4 mg total) by mouth daily. 90 capsule 3   Tiotropium Bromide-Olodaterol (STIOLTO RESPIMAT) 2.5-2.5 MCG/ACT AERS Inhale 2 puffs into the lungs daily. 4 g 11   traMADol (ULTRAM) 50 MG tablet Take 1 tablet (50 mg total) by mouth every 6 (six) hours as needed. (Patient taking differently: Take 50 mg by mouth as needed.) 15 tablet 0   No current facility-administered medications for this visit.    Allergies as of 08/20/2020   (No Known Allergies)    Past Medical History:  Diagnosis Date   Alcoholism ([place])    Depression    GERD (gastroesophageal reflux disease)    Prostate cancer ([facility])     Past Surgical History:  Procedure Laterality Date   NOSE SURGERY      Family History  Problem Relation Age of Onset   Prostate cancer Neg Hx    Colon cancer Neg Hx    Liver disease Neg Hx     Social History   Socioeconomic History   Marital status: Divorced    Spouse name:  Not on file   Number of children: 2   Years of education: Not on file   Highest education level: Not on file  Occupational History   Occupation: retired    Comment: Equities trader  Tobacco Use   Smoking status: Current Every Day Smoker    Packs/day: 1.00    Types: Cigarettes   Smokeless tobacco: Never Used   Tobacco comment: 5-10 cigsarettes a day 12/28/19 [facility]  Vaping Use   Vaping Use: Never used  Substance and Sexual Activity   Alcohol use: Yes    Alcohol/week: 9.0 standard drinks    Types: 6 Cans of beer, 3 Standard drinks or equivalent per week    Comment: 6-7 beers daily   Drug use: Not  Currently    Types: Cocaine, Marijuana    Comment: No IV or intravasal   Sexual activity: Not Currently  Other Topics Concern   Not on file  Social History Narrative   Not on file   Social Determinants of Health   Financial Resource Strain: Not on file  Food Insecurity: Not on file  Transportation Needs: Not on file  Physical Activity: Not on file  Stress: Not on file  Social Connections: Not on file  Intimate Partner Violence: Not on file      ROS:  General: Negative for anorexia, weight loss, fever, chills, fatigue, weakness. Eyes: Negative for vision changes.  ENT: Negative for hoarseness, difficulty swallowing , nasal congestion. CV: Negative for chest pain, angina, palpitations, dyspnea on exertion, peripheral edema.  Respiratory: Negative for dyspnea at rest, dyspnea on exertion, cough, sputum, wheezing.  GI: See history of present illness. GU:  Negative for dysuria, hematuria, urinary incontinence, urinary frequency, nocturnal urination. Some intermittent left flank pain (sharp pain) MS: Negative for joint pain, low back pain.  Derm: Negative for rash or itching.  Neuro: Negative for weakness, abnormal sensation, seizure, frequent headaches, memory loss, confusion.  Psych: Negative for anxiety, depression, suicidal ideation, hallucinations.  Endo: Negative for unusual weight change.  Heme: Negative for bruising or bleeding. Allergy: Negative for rash or hives.    Physical Examination:  BP 131/90    Pulse (!) 120    Temp (!) 96.6 F (35.9 C) (Temporal)    Ht 5\' 8"  (1.727 m)    Wt 152 lb 9.6 oz (69.2 kg)    BMI 23.20 kg/m    General: Well-nourished, well-developed in no acute distress.  Head: Normocephalic, atraumatic.   Eyes: Conjunctiva pink, no icterus. Mouth: masked Neck: Supple without thyromegaly, masses, or lymphadenopathy.  Lungs: Clear to auscultation bilaterally.  Heart: Regular rate and rhythm, no murmurs rubs or gallops.  Abdomen: Bowel sounds  are normal, nontender, nondistended, no hepatosplenomegaly or masses, no abdominal bruits or    hernia , no rebound or guarding.   Rectal: not performed Extremities: No lower extremity edema. No clubbing or deformities.  Neuro: Alert and oriented x 4 , grossly normal neurologically.  Skin: Warm and dry, no rash or jaundice.   Psych: Alert and cooperative, normal mood and affect.  Labs: Labs from February 2022: Glucose 94, BUN 6, creatinine 0.71, albumin 4.1, total bilirubin 0.6, alkaline phosphatase 191, AST 47, ALT 34, white blood cell count 8300, hemoglobin 18.8, hematocrit 54.1, MCV 101.1, platelets 253,000, hepatitis B surface antibody nonreactive, PSA 0.12, hepatitis C antibody nonreactive, hepatitis B surface antigen nonreactive  Imaging Studies: No results found.  Assessment:  70 year old male with history of alcoholism, prostate cancer presenting for further evaluation of abnormal LFTs.  Alkaline phosphatase and AST elevation: Both elevated as outlined above.  Hepatitis B and C serologies negative.  Last liver imaging from 2020.  Abnormal alkaline phosphatase and AST may be secondary to ongoing alcohol use but can also be seen in nonliver related issues such as bone or intestinal sources.    GERD/vomiting: controlled on PPI.  Possibly related to refractory GERD versus gastritis in the setting of alcohol abuse.    Left flank pain: intermittent sharp shooting without urinary symptoms.  Query musculoskeletal source.    History of iron deficiency her daughter: I do not have labs to document dissing current hemoglobin 18.8, patient remains on oral iron.  I suspect he had vitamin deficiencies due to malnutrition last year prior to obtaining assistance from family with regards to nutritional intake.   Plan:  1. Obtain labs to further evaluate liver, iron deficiency. 2. Abdominal ultrasound to evaluate liver and left flank pain.

## 2020-08-21 ENCOUNTER — Encounter: Payer: Self-pay | Admitting: Gastroenterology

## 2020-08-22 DIAGNOSIS — F17210 Nicotine dependence, cigarettes, uncomplicated: Secondary | ICD-10-CM | POA: Diagnosis not present

## 2020-08-22 DIAGNOSIS — J410 Simple chronic bronchitis: Secondary | ICD-10-CM | POA: Diagnosis not present

## 2020-08-27 ENCOUNTER — Ambulatory Visit (HOSPITAL_COMMUNITY)
Admission: RE | Admit: 2020-08-27 | Discharge: 2020-08-27 | Disposition: A | Discharge status: Home / Self Care | Payer: Medicare Other | Source: Ambulatory Visit | Attending: Gastroenterology | Admitting: Gastroenterology

## 2020-08-27 ENCOUNTER — Other Ambulatory Visit (HOSPITAL_COMMUNITY)
Admission: RE | Admit: 2020-08-27 | Discharge: 2020-08-27 | Disposition: A | Discharge status: Home / Self Care | Payer: Medicare Other | Source: Ambulatory Visit | Attending: Gastroenterology | Admitting: Gastroenterology

## 2020-08-27 ENCOUNTER — Other Ambulatory Visit: Payer: Self-pay

## 2020-08-27 DIAGNOSIS — D509 Iron deficiency anemia, unspecified: Secondary | ICD-10-CM | POA: Insufficient documentation

## 2020-08-27 DIAGNOSIS — D7589 Other specified diseases of blood and blood-forming organs: Secondary | ICD-10-CM | POA: Insufficient documentation

## 2020-08-27 DIAGNOSIS — R945 Abnormal results of liver function studies: Secondary | ICD-10-CM | POA: Insufficient documentation

## 2020-08-27 DIAGNOSIS — R748 Abnormal levels of other serum enzymes: Secondary | ICD-10-CM | POA: Diagnosis not present

## 2020-08-27 DIAGNOSIS — F1020 Alcohol dependence, uncomplicated: Secondary | ICD-10-CM | POA: Insufficient documentation

## 2020-08-27 DIAGNOSIS — K7689 Other specified diseases of liver: Secondary | ICD-10-CM | POA: Diagnosis not present

## 2020-08-27 DIAGNOSIS — R109 Unspecified abdominal pain: Secondary | ICD-10-CM | POA: Insufficient documentation

## 2020-08-27 LAB — IRON AND TIBC
Iron: 205 ug/dL — ABNORMAL HIGH (ref 45–182)
Saturation Ratios: 54 % — ABNORMAL HIGH (ref 17.9–39.5)
TIBC: 383 ug/dL (ref 250–450)
UIBC: 178 ug/dL

## 2020-08-27 LAB — HEPATIC FUNCTION PANEL
ALT: 34 U/L (ref 0–44)
AST: 41 U/L (ref 15–41)
Albumin: 4.2 g/dL (ref 3.5–5.0)
Alkaline Phosphatase: 158 U/L — ABNORMAL HIGH (ref 38–126)
Bilirubin, Direct: 0.2 mg/dL (ref 0.0–0.2)
Indirect Bilirubin: 0.6 mg/dL (ref 0.3–0.9)
Total Bilirubin: 0.8 mg/dL (ref 0.3–1.2)
Total Protein: 7.9 g/dL (ref 6.5–8.1)

## 2020-08-27 LAB — FOLATE: Folate: 12.4 ng/mL (ref >5.9)

## 2020-08-27 LAB — GAMMA GT: GGT: 140 U/L — ABNORMAL HIGH (ref 7–50)

## 2020-08-27 LAB — VITAMIN B12: Vitamin B-12: 161 pg/mL — ABNORMAL LOW (ref 180–914)

## 2020-08-27 LAB — LIPASE, BLOOD: Lipase: 21 U/L (ref 11–51)

## 2020-08-28 LAB — FERRITIN: Ferritin: 234 ng/mL (ref 24–336)

## 2020-08-28 LAB — ANTI-SMOOTH MUSCLE ANTIBODY, IGG: F-Actin IgG: 19 Units (ref 0–19)

## 2020-08-28 LAB — MITOCHONDRIAL ANTIBODIES: Mitochondrial M2 Ab, IgG: <20 Units (ref 0.0–20.0)

## 2020-08-30 ENCOUNTER — Telehealth: Payer: Self-pay

## 2020-08-30 NOTE — Telephone Encounter (Signed)
Patient missed 09/28 appointment for lupron 45 mg.   Called patient/daughter to reschedule. Daughter rescheduled appt but would like to discuss with MD about injections and if still needed. Daughter wishes to get PSA done day of appointment as well.

## 2020-09-03 ENCOUNTER — Other Ambulatory Visit: Payer: Self-pay

## 2020-09-03 ENCOUNTER — Encounter: Payer: Self-pay | Admitting: Internal Medicine

## 2020-09-03 DIAGNOSIS — R945 Abnormal results of liver function studies: Secondary | ICD-10-CM

## 2020-09-03 DIAGNOSIS — D509 Iron deficiency anemia, unspecified: Secondary | ICD-10-CM

## 2020-09-03 DIAGNOSIS — R7989 Other specified abnormal findings of blood chemistry: Secondary | ICD-10-CM

## 2020-09-03 NOTE — Telephone Encounter (Signed)
Ok that is fine. We can recheck the PSA and I will discuss the ADT with her

## 2020-09-21 ENCOUNTER — Ambulatory Visit: Payer: Medicare Other | Admitting: Urology

## 2020-09-22 DIAGNOSIS — J410 Simple chronic bronchitis: Secondary | ICD-10-CM | POA: Diagnosis not present

## 2020-09-22 DIAGNOSIS — F17208 Nicotine dependence, unspecified, with other nicotine-induced disorders: Secondary | ICD-10-CM | POA: Diagnosis not present

## 2020-09-27 ENCOUNTER — Telehealth: Payer: Self-pay | Admitting: Gastroenterology

## 2020-09-27 NOTE — Telephone Encounter (Signed)
noted 

## 2020-09-27 NOTE — Telephone Encounter (Signed)
Called to speak with patient's daughter Joseph Gentry. When I discussed results on 09/24/20 she had mentioned her dad had been on a protein pill and would call with name. Tried to call patient to see if she found out the name yet. Also to make sure that he followed up with PCP for low B12 level that was seen on recent labs (notified previously). Left this information on voicemail and requested a return call.   Dena, please make sure a copy of his B12 level gets forwarded to PCP this week and please speak with PCP's nurse and ask them to address his low B12.

## 2020-09-28 NOTE — Telephone Encounter (Signed)
Phoned and LM on vm of the pt's PCP (Dr. Josephine Cables office) that a fax with the pt's lab results were faxed so the Dr can address this issue with the pt. Will call next week to speak with the nurse.

## 2020-10-01 ENCOUNTER — Telehealth: Payer: Self-pay | Admitting: Gastroenterology

## 2020-10-01 NOTE — Telephone Encounter (Signed)
Documentation of conversation with patient's daughter, Joseph Gentry, documented under result notes but additional information provided here per recommendations of risk management.   Spoke to patient's daughter, Joseph Gentry (goes by [employer]"), on 09/24/20. She requested a call back to discuss lab and ultrasound findings. She had several questions/comments and spoke for over 15 minutes without interruption. She voiced concerns that her father not be labeled or stereotyped as an alcoholic because she was afraid that his care would be less because of this. She said that she thought I had blamed all of his liver issues on the alcohol and that I was not receptive to the possibility that something besides alcohol could be cause of his abnormal liver function tests (LFTS). When I asked her why she thought that, she said that was what was told by to her by Dena (our Davis) from my notations in the chart. She went on to explain that her dad had not had any issues with abnormal LFTs prior to February 2022. She said, "Let's say he has been drinking excessively for 10 years", then why were his numbers not abnormal before. I explained I did not have access to his labs prior to 07/2020 so I could not really speak of that. She appeared upset that I "could take her word" on some things like how much her father drinks but "would not take her word" on his LFTs being normal before.   I continued to let patient voice her concerns, but at 25 minutes into the phone call she had not allowed me to go over the labs, discuss what the results mean, what the plan is. She was not interested in letting me talk and the conversation was not progressing. She became upset when I asked her to give me a chance to speak and give her the details of the findings. She told me "just because you have doctor in front of your name does not mean you don't have to listen to what I am saying and that you can be rude". I told her I am only trying to answer her  questions but before I can get a sentence completed that she interrupts and it is making it difficult to provide her with an explanation to her questions. She states she wants me to add documentation to her dad's records that his abnormal LFTs may be due to other causes besides alcohol. That it could be due to his radiation, "hormone shots" for prostate cancer, or the "protein pill" and shakes he has been on.   I told her that I felt like his alcohol consumption does play a significant part in the abnormal labs but I also have provided her father with the typical/appropriate work up for initial evaluation of abnormal LFTs. I have investigated other causes besides etoh use. I told her the findings so far can be seen with excessive alcohol use but we have ruled out Hep B and C, Primary Biliary Cholangitis, cirrhosis, and we are looking into the cause of his elevated iron.   She finally did listen to my explanation of current work up and future labs that are ordered. I was able to explain what has been excluded so far. I offered her to bring her dad back to see one of the doctors but she said she would "take him to another doctor if she brought him anywhere". She stressed that she wanted my documentation to mention that the protein pill or shakes, hormone pill, or radiation could have contributed to his  abnormal liver function tests as well. She wants it clear that he has had radiation and hormone treatments for his prostate cancer. She said if I could not add it "there are ways to get this information added". Prior to today I did not know he was on a "protein pill" and I asked her to get the name so I could look into potential side effects. I also asked her to have her dad stop his iron because his iron levels are high, which could be from excessive oral iron, but we are ruling out other causes such as hemochromatosis.   I encouraged her to have her father sign up for Mychart. He could share my office notes  with her and any result notes. She could also get "by proxy" for his account if needed. I told her that a lot of what she is asking me to document is already in my notes. She states she would look and let me know if she had additional questions. She said she would call with name of the protein pill. I also clarified with her that what I documented about the amount of alcohol use, although decreased overall, was still 6-7 beers per day, and she agreed. I explained that "excessive alcohol" by definition for a male would be more than two-three 12 ounce beers per day. The call then ended.

## 2020-10-22 DIAGNOSIS — F17208 Nicotine dependence, unspecified, with other nicotine-induced disorders: Secondary | ICD-10-CM | POA: Diagnosis not present

## 2020-11-22 DIAGNOSIS — F17210 Nicotine dependence, cigarettes, uncomplicated: Secondary | ICD-10-CM | POA: Diagnosis not present

## 2020-11-22 DIAGNOSIS — M1990 Unspecified osteoarthritis, unspecified site: Secondary | ICD-10-CM | POA: Diagnosis not present

## 2020-11-26 ENCOUNTER — Ambulatory Visit: Payer: Medicare Other | Admitting: Urology

## 2020-12-07 ENCOUNTER — Encounter: Payer: Self-pay | Admitting: Internal Medicine

## 2020-12-10 ENCOUNTER — Ambulatory Visit: Payer: Medicare Other | Admitting: Gastroenterology

## 2020-12-22 DIAGNOSIS — F17208 Nicotine dependence, unspecified, with other nicotine-induced disorders: Secondary | ICD-10-CM | POA: Diagnosis not present

## 2020-12-25 DIAGNOSIS — N390 Urinary tract infection, site not specified: Secondary | ICD-10-CM | POA: Diagnosis not present

## 2020-12-25 DIAGNOSIS — Z79899 Other long term (current) drug therapy: Secondary | ICD-10-CM | POA: Diagnosis not present

## 2020-12-25 DIAGNOSIS — R945 Abnormal results of liver function studies: Secondary | ICD-10-CM | POA: Diagnosis not present

## 2020-12-25 DIAGNOSIS — R3982 Chronic bladder pain: Secondary | ICD-10-CM | POA: Diagnosis not present

## 2020-12-25 DIAGNOSIS — Z0001 Encounter for general adult medical examination with abnormal findings: Secondary | ICD-10-CM | POA: Diagnosis not present

## 2020-12-25 DIAGNOSIS — R748 Abnormal levels of other serum enzymes: Secondary | ICD-10-CM | POA: Diagnosis not present

## 2020-12-27 ENCOUNTER — Other Ambulatory Visit (HOSPITAL_COMMUNITY): Payer: Self-pay | Admitting: Gerontology

## 2020-12-27 DIAGNOSIS — R3989 Other symptoms and signs involving the genitourinary system: Secondary | ICD-10-CM

## 2021-01-03 ENCOUNTER — Ambulatory Visit (HOSPITAL_COMMUNITY): Admission: RE | Admit: 2021-01-03 | Payer: Medicare Other | Source: Ambulatory Visit

## 2021-01-03 ENCOUNTER — Ambulatory Visit (HOSPITAL_COMMUNITY)
Admission: RE | Admit: 2021-01-03 | Discharge: 2021-01-03 | Disposition: A | Discharge status: Home / Self Care | Payer: Medicare Other | Source: Ambulatory Visit | Attending: Gerontology | Admitting: Gerontology

## 2021-01-03 ENCOUNTER — Other Ambulatory Visit: Payer: Self-pay

## 2021-01-03 DIAGNOSIS — N3289 Other specified disorders of bladder: Secondary | ICD-10-CM | POA: Diagnosis not present

## 2021-01-03 DIAGNOSIS — R3989 Other symptoms and signs involving the genitourinary system: Secondary | ICD-10-CM | POA: Insufficient documentation

## 2021-01-10 DIAGNOSIS — M19012 Primary osteoarthritis, left shoulder: Secondary | ICD-10-CM | POA: Diagnosis not present

## 2021-01-10 DIAGNOSIS — M19011 Primary osteoarthritis, right shoulder: Secondary | ICD-10-CM | POA: Diagnosis not present

## 2021-01-16 ENCOUNTER — Ambulatory Visit: Payer: Medicare Other | Admitting: Internal Medicine

## 2021-01-25 DIAGNOSIS — F17210 Nicotine dependence, cigarettes, uncomplicated: Secondary | ICD-10-CM | POA: Diagnosis not present

## 2021-01-25 DIAGNOSIS — J410 Simple chronic bronchitis: Secondary | ICD-10-CM | POA: Diagnosis not present

## 2021-02-22 DIAGNOSIS — Z23 Encounter for immunization: Secondary | ICD-10-CM | POA: Diagnosis not present

## 2021-02-22 DIAGNOSIS — R3982 Chronic bladder pain: Secondary | ICD-10-CM | POA: Diagnosis not present

## 2021-02-22 DIAGNOSIS — M1990 Unspecified osteoarthritis, unspecified site: Secondary | ICD-10-CM | POA: Diagnosis not present

## 2021-02-22 DIAGNOSIS — E876 Hypokalemia: Secondary | ICD-10-CM | POA: Diagnosis not present

## 2021-03-06 ENCOUNTER — Encounter: Payer: Self-pay | Admitting: Internal Medicine

## 2021-03-06 ENCOUNTER — Ambulatory Visit: Payer: Medicare Other | Admitting: Internal Medicine

## 2021-03-08 ENCOUNTER — Ambulatory Visit: Payer: Medicare Other | Admitting: Urology

## 2021-03-24 DIAGNOSIS — F17210 Nicotine dependence, cigarettes, uncomplicated: Secondary | ICD-10-CM | POA: Diagnosis not present

## 2021-03-26 ENCOUNTER — Ambulatory Visit: Payer: Medicare Other | Admitting: Gastroenterology

## 2021-04-24 DIAGNOSIS — F17210 Nicotine dependence, cigarettes, uncomplicated: Secondary | ICD-10-CM | POA: Diagnosis not present

## 2021-04-24 DIAGNOSIS — J410 Simple chronic bronchitis: Secondary | ICD-10-CM | POA: Diagnosis not present

## 2021-04-26 ENCOUNTER — Ambulatory Visit (INDEPENDENT_AMBULATORY_CARE_PROVIDER_SITE_OTHER): Payer: Medicare Other | Admitting: Urology

## 2021-04-26 ENCOUNTER — Encounter: Payer: Self-pay | Admitting: Urology

## 2021-04-26 ENCOUNTER — Other Ambulatory Visit: Payer: Self-pay

## 2021-04-26 VITALS — BP 122/80 | HR 121

## 2021-04-26 DIAGNOSIS — N138 Other obstructive and reflux uropathy: Secondary | ICD-10-CM

## 2021-04-26 DIAGNOSIS — R109 Unspecified abdominal pain: Secondary | ICD-10-CM

## 2021-04-26 DIAGNOSIS — N401 Enlarged prostate with lower urinary tract symptoms: Secondary | ICD-10-CM

## 2021-04-26 DIAGNOSIS — R3912 Poor urinary stream: Secondary | ICD-10-CM

## 2021-04-26 DIAGNOSIS — C61 Malignant neoplasm of prostate: Secondary | ICD-10-CM | POA: Diagnosis not present

## 2021-04-26 LAB — BLADDER SCAN AMB NON-IMAGING: Scan Result: 20

## 2021-04-26 MED ORDER — TAMSULOSIN HCL 0.4 MG PO CAPS: 0.4000 mg | ORAL_CAPSULE | Freq: Every day | ORAL | 3 refills | Status: DC

## 2021-04-26 NOTE — Progress Notes (Signed)
post void residual=20  Urological Symptom Review  Patient is experiencing the following symptoms: Hard to postpone urination Get up at night to urinate Leakage of urine Stream starts and stops Trouble starting stream Have to strain to urinate   Review of Systems  Gastrointestinal (upper)  : Indigestion/heartburn  Gastrointestinal (lower) : Negative for lower GI symptoms  Constitutional : Negative for symptoms  Skin: Negative for skin symptoms  Eyes: Blurred vision Double vision  Ear/Nose/Throat : Negative for Ear/Nose/Throat symptoms  Hematologic/Lymphatic: Negative for Hematologic/Lymphatic symptoms  Cardiovascular : Negative for cardiovascular symptoms  Respiratory : Negative for respiratory symptoms  Endocrine: Negative for endocrine symptoms  Musculoskeletal: Back pain  Neurological: Negative for neurological symptoms  Psychologic: Negative for psychiatric symptoms

## 2021-04-26 NOTE — Progress Notes (Signed)
04/26/2021 12:16 PM   Aquilla Solian 01/30/1951 [id number]  Referring provider: Rosita Fire, MD [place],  [place] [address]  Followup BPh and prostate cancer   HPI: Mr Bozzi is a 70yo here for followup for BPH and prostate cancer. IPSS 10 QOL 4. He has urinary urgency and a weak stream.  He has stopped his flomax 0.4mg  since last visit. He was last seen 1 year ago. PSA 0.12. He has left flank pain that is sharp, intermittent, nonraditing. His pain is better when he drinks more water/cranberry juice. No hx of nephrolithiasis. No hematuria or dysuria.    PMH: Past Medical History:  Diagnosis Date   Alcoholism ([place])    Depression    GERD (gastroesophageal reflux disease)    Prostate cancer [facility])     Surgical History: Past Surgical History:  Procedure Laterality Date   NOSE SURGERY      Home Medications:  Allergies as of 04/26/2021   No Known Allergies      Medication List        Accurate as of April 26, 2021 12:16 PM. If you have any questions, ask your nurse or doctor.          albuterol 108 (90 Base) MCG/ACT inhaler Commonly known as: VENTOLIN HFA Inhale 2 puffs into the lungs every 4 (four) hours as needed for wheezing or shortness of breath.   cetirizine 10 MG tablet Commonly known as: ZYRTEC Take 1 tablet (10 mg total) by mouth daily. What changed:  when to take this reasons to take this   FeroSul 325 (65 FE) MG tablet Generic drug: ferrous sulfate Take 325 mg by mouth daily.   fluticasone 50 MCG/ACT nasal spray Commonly known as: FLONASE Place 2 sprays into both nostrils daily. What changed:  when to take this reasons to take this   mirtazapine 15 MG tablet Commonly known as: REMERON Take 15 mg by mouth at bedtime.   multivitamin with minerals Tabs tablet Take 1 tablet by mouth daily.   omeprazole 40 MG capsule Commonly known as: PRILOSEC Take 40 mg by mouth daily.   Stiolto Respimat 2.5-2.5 MCG/ACT  Aers Generic drug: Tiotropium Bromide-Olodaterol Inhale 2 puffs into the lungs daily.   tamsulosin 0.4 MG Caps capsule Commonly known as: FLOMAX Take 1 capsule (0.4 mg total) by mouth daily.   traMADol 50 MG tablet Commonly known as: ULTRAM Take 1 tablet (50 mg total) by mouth every 6 (six) hours as needed. What changed: when to take this        Allergies: No Known Allergies  Family History: Family History  Problem Relation Age of Onset   Prostate cancer Neg Hx    Colon cancer Neg Hx    Liver disease Neg Hx     Social History:  reports that he has been smoking cigarettes. He has been smoking an average of 1 pack per day. He has never used smokeless tobacco. He reports current alcohol use of about 9.0 standard drinks per week. He reports that he does not currently use drugs after having used the following drugs: Cocaine and Marijuana.  ROS: All other review of systems were reviewed and are negative except what is noted above in HPI  Physical Exam: BP 122/80   Pulse (!) 121   Constitutional:  Alert and oriented, No acute distress. HEENT: [place] AT, moist mucus membranes.  Trachea midline, no masses. Cardiovascular: No clubbing, cyanosis, or edema. Respiratory: Normal respiratory effort, no increased work of  breathing. GI: Abdomen is soft, nontender, nondistended, no abdominal masses GU: No CVA tenderness.  Lymph: No cervical or inguinal lymphadenopathy. Skin: No rashes, bruises or suspicious lesions. Neurologic: Grossly intact, no focal deficits, moving all 4 extremities. Psychiatric: Normal mood and affect.  Laboratory Data: Lab Results  Component Value Date   WBC 6.4 12/18/2015   HGB 15.6 12/18/2015   HCT 46.0 12/18/2015   MCV 100.3 (H) 12/18/2015   PLT 229 12/18/2015    Lab Results  Component Value Date   CREATININE 1.00 12/18/2015    Lab Results  Component Value Date   PSA <0.1 08/19/2019    Lab Results  Component Value Date   TESTOSTERONE 54 (L)  08/19/2019    No results found for: HGBA1C  Urinalysis No results found for: COLORURINE, APPEARANCEUR, LABSPEC, PHURINE, GLUCOSEU, HGBUR, BILIRUBINUR, KETONESUR, PROTEINUR, UROBILINOGEN, NITRITE, LEUKOCYTESUR  No results found for: LABMICR, [place], RBCUA, LABEPIT, MUCUS, BACTERIA  Pertinent Imaging:  No results found for this or any previous visit.  No results found for this or any previous visit.  No results found for this or any previous visit.  No results found for this or any previous visit.  No results found for this or any previous visit.  No results found for this or any previous visit.  No results found for this or any previous visit.  No results found for this or any previous visit.   Assessment & Plan:    1. Weak urinary stream -restart flomax 0.4mg  daily - Urinalysis, Routine w reflex microscopic  2. Benign prostatic hyperplasia with urinary obstruction -restart flomax 0.4mg  daily - Urinalysis, Routine w reflex microscopic - BLADDER SCAN AMB NON-IMAGING  3. Prostate cancer ([place]) -PSA today - PSA  4. Left flank pain -Renal US   No follow-ups on file.  Nicolette Bang, MD  [facility] [place]

## 2021-04-26 NOTE — Patient Instructions (Signed)
Prostate Cancer °The prostate is a small gland that helps make semen. It is located below a man's bladder, in front of the rectum. Prostate cancer is when abnormal cells grow in this gland. °What are the causes? °The cause of this condition is not known. °What increases the risk? °Being age 72 or older. °Having a family history of prostate cancer. °Having a family history of cancer of the breasts or ovaries. °Having genes that are passed from parent to child (inherited). °Having Lynch syndrome. °African American men and men of African descent are diagnosed with prostate cancer at higher rates than other men. °What are the signs or symptoms? °Problems peeing (urinating). This may include: °A stream that is weak, or pee that stops and starts. °Trouble starting or stopping your pee. °Trouble emptying all of your pee. °Needing to pee more often, especially at night. °Blood in your pee or semen. °Pain in the: °Lower back. °Lower belly (abdomen). °Hips. °Trouble getting an erection. °Weakness or numbness in the legs or feet. °How is this treated? °Treatment for this condition depends on: °How much the cancer has spread. °Your age. °The kind of treatment you want. °Your health. °Treatments include: °Being watched. This is called observation. You will be tested from time to time, but you will not get treated. Tests are to make sure that the cancer is not growing. °Surgery. This may be done to: °Take out (remove) the prostate. °Freeze and kill cancer cells. °Radiation. This uses a strong beam of energy to kill cancer cells. °Chemotherapy. This uses medicines that stop cancer cells from increasing. This kills cancer cells and healthy cells. °Targeted therapy. This kills cancer cells only. Healthy cells are not affected. °Hormone treatment. This stops the body from making hormones that help the cancer cells grow. °Follow these instructions at home: °Lifestyle °Do not smoke or use any products that contain nicotine or tobacco.  If you need help quitting, ask your doctor. °Eat a healthy diet. °Treatment may affect your ability to have sex. If you have a partner, touch, hold, hug, and caress your partner to have intimate moments. °Get plenty of sleep. °Ask your doctor for help to find a support group for men with prostate cancer. °General instructions °Take over-the-counter and prescription medicines only as told by your doctor. °If you have to go to the hospital, let your cancer doctor (oncologist) know. °Keep all follow-up visits. °Where to find more information °American Cancer Society: www.cancer.org °American Society of Clinical Oncology: www.cancer.net °National Cancer Institute: www.cancer.gov °Contact a doctor if: °You have new or more trouble peeing. °You have new or more blood in your pee. °You have new or more pain in your hips, back, or chest. °Get help right away if: °You have weakness in your legs. °You lose feeling in your legs. °You cannot control your pee or your poop (stool). °You have chills or a fever. °Summary °The prostate is a male gland that helps make semen. °Prostate cancer is when abnormal cells grow in this gland. °Treatment includes doing surgery, using medicines, using strong beams of energy, or watching without treatment. °Ask your doctor for help to find a support group for men with prostate cancer. °Contact a doctor if you have problems peeing or have any new pain that you did not have before. °This information is not intended to replace advice given to you by your health care provider. Make sure you discuss any questions you have with your health care provider. °Document Revised: 08/29/2020 Document Reviewed: 08/29/2020 °Elsevier   Patient Education © 2022 Elsevier Inc. ° °

## 2021-04-27 LAB — PSA: Prostate Specific Ag, Serum: 0.1 ng/mL (ref 0.0–4.0)

## 2021-04-30 NOTE — Progress Notes (Signed)
Results mailed 

## 2021-05-06 IMAGING — DX DG SHOULDER 2+V*R*
3 series · 3 of 3 positions shown · non-contrast
Comparison: None.

CLINICAL DATA: Chronic right shoulder pain without known injury.

EXAM:
RIGHT SHOULDER - 2+ VIEW

[shoulder grashey]
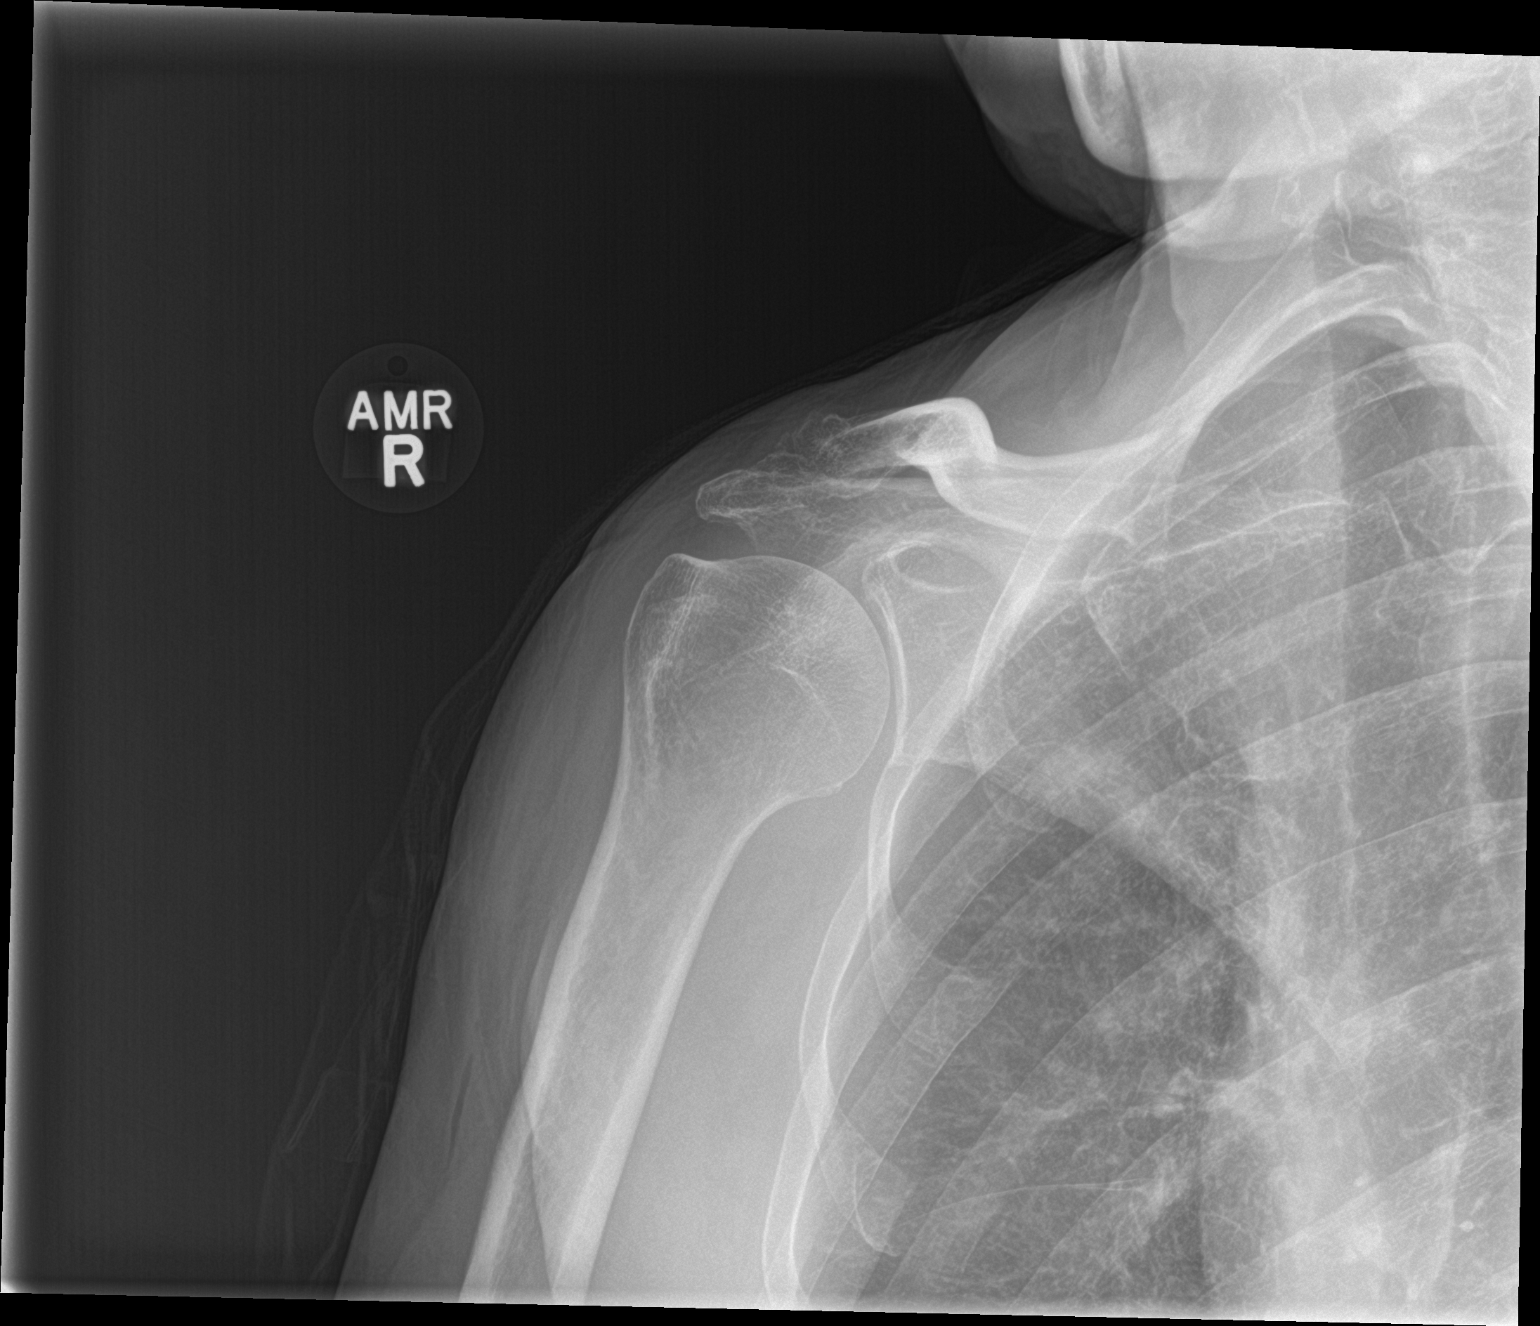

[shoulder y view]
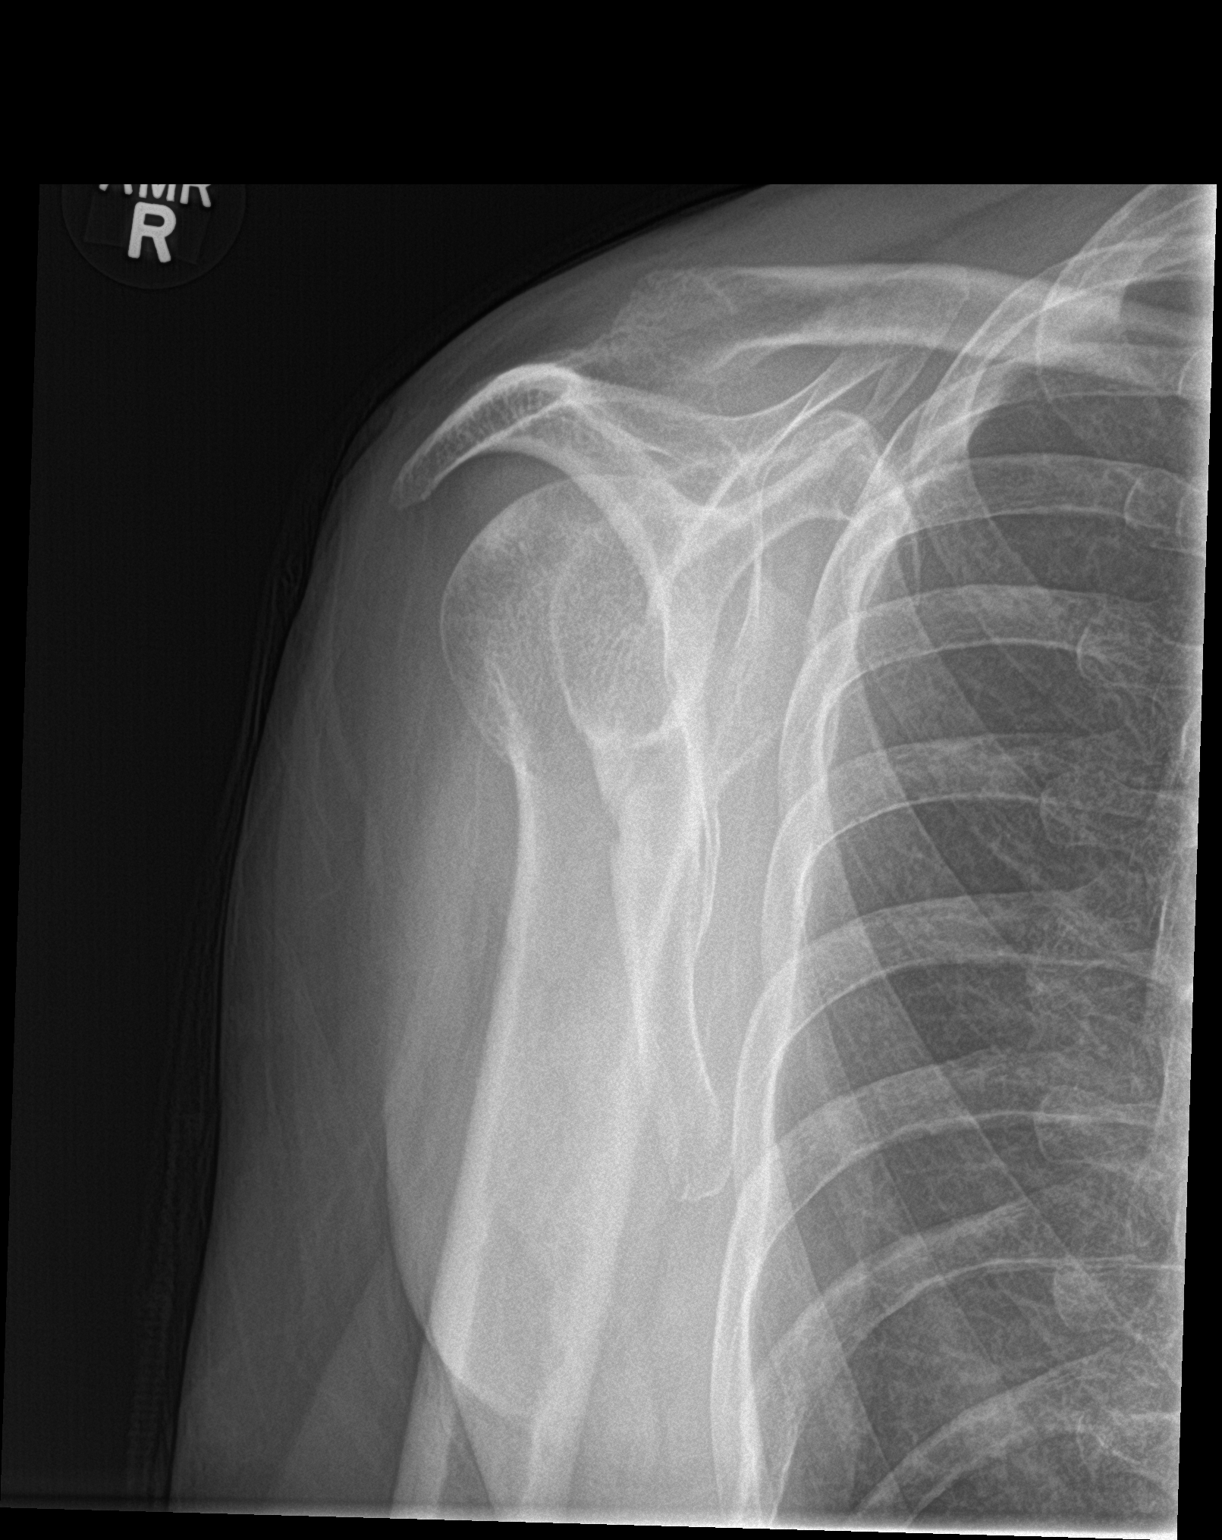

[shoulder axillary]
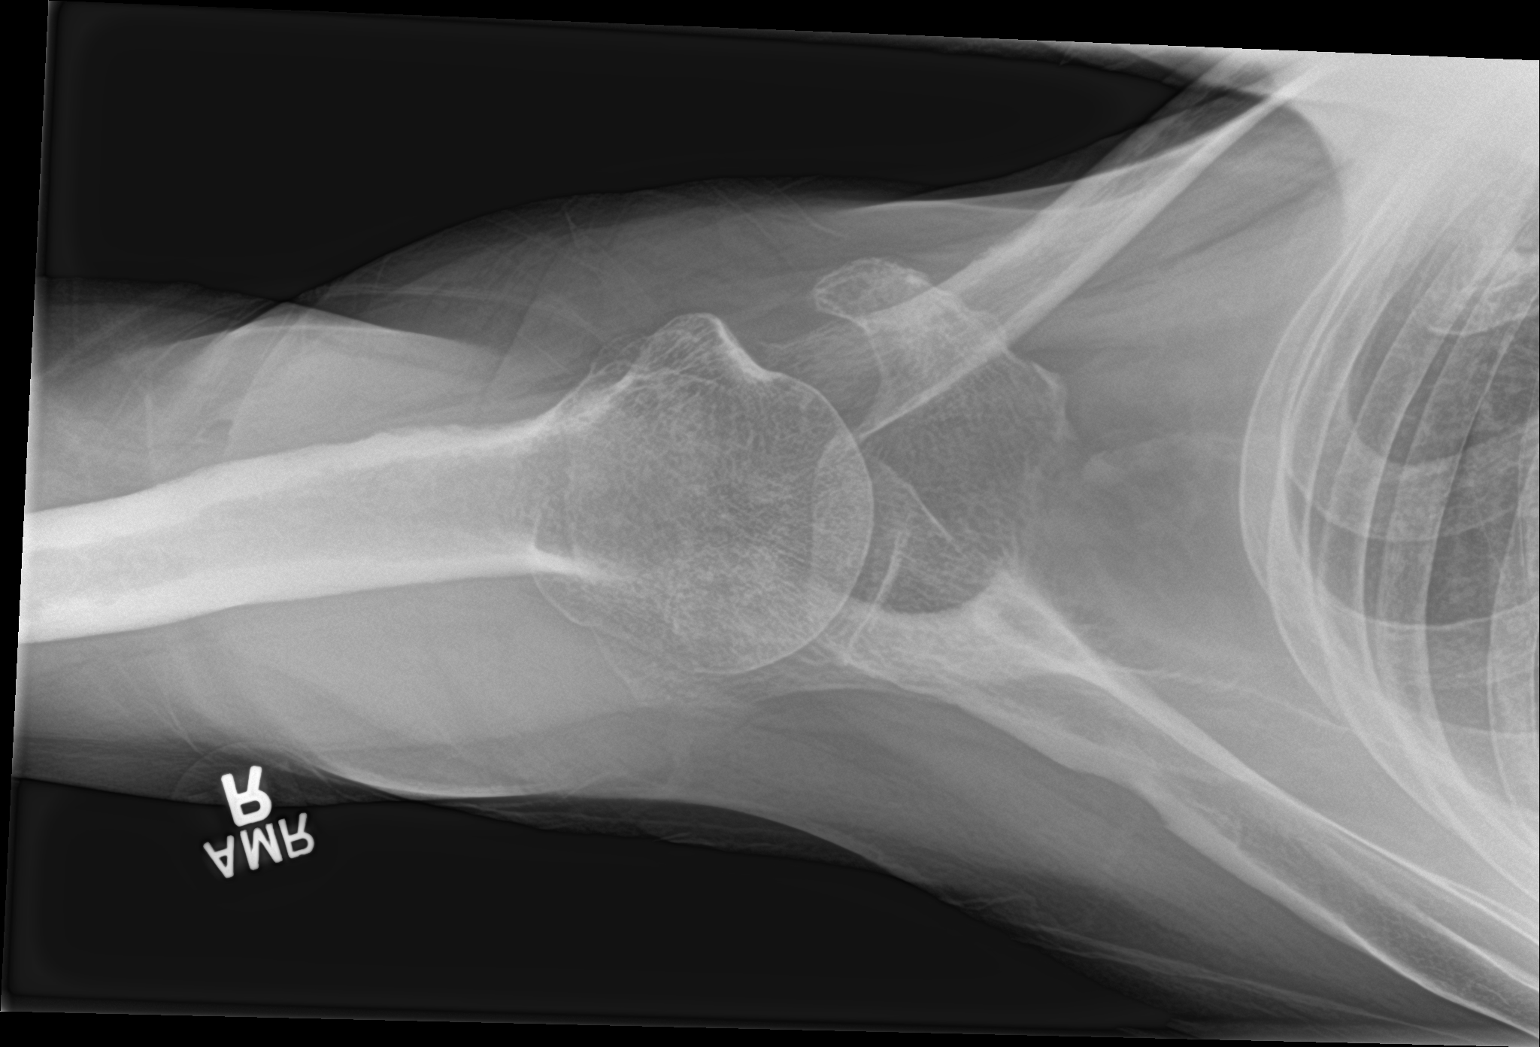

[3 of 3 positions shown; findings below may reference images not displayed]

FINDINGS: There is no evidence of fracture or dislocation. Moderate
degenerative changes seen involving the right acromioclavicular
joint. Soft tissues are unremarkable.
IMPRESSION: Moderate degenerative joint disease of the right acromioclavicular
joint. No acute abnormality seen in the right shoulder.

## 2021-05-08 ENCOUNTER — Other Ambulatory Visit: Payer: Self-pay

## 2021-05-08 ENCOUNTER — Encounter: Payer: Self-pay | Admitting: Urology

## 2021-05-08 ENCOUNTER — Ambulatory Visit (INDEPENDENT_AMBULATORY_CARE_PROVIDER_SITE_OTHER): Payer: Medicare Other | Admitting: Urology

## 2021-05-08 ENCOUNTER — Ambulatory Visit: Payer: Medicare Other | Admitting: Internal Medicine

## 2021-05-08 ENCOUNTER — Ambulatory Visit (HOSPITAL_COMMUNITY)
Admission: RE | Admit: 2021-05-08 | Discharge: 2021-05-08 | Disposition: A | Discharge status: Home / Self Care | Payer: Medicare Other | Source: Ambulatory Visit | Attending: Urology | Admitting: Urology

## 2021-05-08 VITALS — BP 105/70 | HR 147

## 2021-05-08 DIAGNOSIS — N401 Enlarged prostate with lower urinary tract symptoms: Secondary | ICD-10-CM | POA: Diagnosis not present

## 2021-05-08 DIAGNOSIS — R3912 Poor urinary stream: Secondary | ICD-10-CM | POA: Diagnosis not present

## 2021-05-08 DIAGNOSIS — R109 Unspecified abdominal pain: Secondary | ICD-10-CM | POA: Insufficient documentation

## 2021-05-08 DIAGNOSIS — N138 Other obstructive and reflux uropathy: Secondary | ICD-10-CM | POA: Diagnosis not present

## 2021-05-08 DIAGNOSIS — C61 Malignant neoplasm of prostate: Secondary | ICD-10-CM

## 2021-05-08 MED ORDER — TAMSULOSIN HCL 0.4 MG PO CAPS: 0.4000 mg | ORAL_CAPSULE | Freq: Every day | ORAL | 3 refills | Status: DC

## 2021-05-08 NOTE — Progress Notes (Signed)

## 2021-05-08 NOTE — Progress Notes (Signed)
05/08/2021 1:43 PM   Joseph Gentry [birth year] [id number]  Referring provider: Rosita Fire, MD [place],  [place] [id number]  Followup BPH and prostate cancer   HPI: Joseph Gentry is a 70yo here for followup for BPH and prostate cancer. PSA 0.1. We restarted flomax last visit which significantly improved his LUTS. IPSS 5 QOL 2. Urine stream strong. No straining to urinate. Renal US today shows no hydronephrosis and he calculi. No other complaitns today   PMH: Past Medical History:  Diagnosis Date   Alcoholism ([place])    Depression    GERD (gastroesophageal reflux disease)    Prostate cancer [facility])     Surgical History: Past Surgical History:  Procedure Laterality Date   NOSE SURGERY      Home Medications:  Allergies as of 05/08/2021   No Known Allergies      Medication List        Accurate as of May 08, 2021  1:43 PM. If you have any questions, ask your nurse or doctor.          albuterol 108 (90 Base) MCG/ACT inhaler Commonly known as: VENTOLIN HFA Inhale 2 puffs into the lungs every 4 (four) hours as needed for wheezing or shortness of breath.   cetirizine 10 MG tablet Commonly known as: ZYRTEC Take 1 tablet (10 mg total) by mouth daily. What changed:  when to take this reasons to take this   FeroSul 325 (65 FE) MG tablet Generic drug: ferrous sulfate Take 325 mg by mouth daily.   fluticasone 50 MCG/ACT nasal spray Commonly known as: FLONASE Place 2 sprays into both nostrils daily. What changed:  when to take this reasons to take this   mirtazapine 15 MG tablet Commonly known as: REMERON Take 15 mg by mouth at bedtime.   multivitamin with minerals Tabs tablet Take 1 tablet by mouth daily.   omeprazole 40 MG capsule Commonly known as: PRILOSEC Take 40 mg by mouth daily.   Stiolto Respimat 2.5-2.5 MCG/ACT Aers Generic drug: Tiotropium Bromide-Olodaterol Inhale 2 puffs into the lungs daily.   tamsulosin 0.4 MG  Caps capsule Commonly known as: FLOMAX Take 1 capsule (0.4 mg total) by mouth daily.   traMADol 50 MG tablet Commonly known as: ULTRAM Take 1 tablet (50 mg total) by mouth every 6 (six) hours as needed.        Allergies: No Known Allergies  Family History: Family History  Problem Relation Age of Onset   Prostate cancer Neg Hx    Colon cancer Neg Hx    Liver disease Neg Hx     Social History:  reports that he has been smoking cigarettes. He has been smoking an average of 1 pack per day. He has never used smokeless tobacco. He reports current alcohol use of about 9.0 standard drinks per week. He reports that he does not currently use drugs after having used the following drugs: Cocaine and Marijuana.  ROS: All other review of systems were reviewed and are negative except what is noted above in HPI  Physical Exam: BP 105/70   Pulse (!) 147   Constitutional:  Alert and oriented, No acute distress. HEENT: [place] AT, moist mucus membranes.  Trachea midline, no masses. Cardiovascular: No clubbing, cyanosis, or edema. Respiratory: Normal respiratory effort, no increased work of breathing. GI: Abdomen is soft, nontender, nondistended, no abdominal masses GU: No CVA tenderness.  Lymph: No cervical or inguinal lymphadenopathy. Skin: No rashes, bruises or suspicious lesions. Neurologic:  Grossly intact, no focal deficits, moving all 4 extremities. Psychiatric: Normal mood and affect.  Laboratory Data: Lab Results  Component Value Date   WBC 6.4 12/18/2015   HGB 15.6 12/18/2015   HCT 46.0 12/18/2015   MCV 100.3 (H) 12/18/2015   PLT 229 12/18/2015    Lab Results  Component Value Date   CREATININE 1.00 12/18/2015    Lab Results  Component Value Date   PSA <0.1 08/19/2019    Lab Results  Component Value Date   TESTOSTERONE 54 (L) 08/19/2019    No results found for: HGBA1C  Urinalysis No results found for: COLORURINE, APPEARANCEUR, LABSPEC, PHURINE, GLUCOSEU, HGBUR,  BILIRUBINUR, KETONESUR, PROTEINUR, UROBILINOGEN, NITRITE, LEUKOCYTESUR  No results found for: LABMICR, [place], RBCUA, LABEPIT, MUCUS, BACTERIA  Pertinent Imaging: Renal US today: Images reviewed and discussed with the patient No results found for this or any previous visit.  No results found for this or any previous visit.  No results found for this or any previous visit.  No results found for this or any previous visit.  No results found for this or any previous visit.  No results found for this or any previous visit.  No results found for this or any previous visit.  No results found for this or any previous visit.   Assessment & Plan:    1. Benign prostatic hyperplasia with urinary obstruction -flomax 0.4mg  daily  2. Prostate cancer ([place]) -RTC 6 months with PSA  3. Weak urinary stream -Flomax 0.4mg  daily   No follow-ups on file.  Nicolette Bang, MD  [facility] [place]

## 2021-05-08 NOTE — Patient Instructions (Signed)

## 2021-05-24 DIAGNOSIS — R3982 Chronic bladder pain: Secondary | ICD-10-CM | POA: Diagnosis not present

## 2021-05-24 DIAGNOSIS — F17208 Nicotine dependence, unspecified, with other nicotine-induced disorders: Secondary | ICD-10-CM | POA: Diagnosis not present

## 2021-05-28 NOTE — Progress Notes (Signed)
Results printed and mailed.   

## 2021-06-24 DIAGNOSIS — F17210 Nicotine dependence, cigarettes, uncomplicated: Secondary | ICD-10-CM | POA: Diagnosis not present

## 2021-06-24 DIAGNOSIS — J410 Simple chronic bronchitis: Secondary | ICD-10-CM | POA: Diagnosis not present

## 2021-07-26 DIAGNOSIS — K219 Gastro-esophageal reflux disease without esophagitis: Secondary | ICD-10-CM | POA: Diagnosis not present

## 2021-07-26 DIAGNOSIS — F17210 Nicotine dependence, cigarettes, uncomplicated: Secondary | ICD-10-CM | POA: Diagnosis not present

## 2021-07-26 DIAGNOSIS — R748 Abnormal levels of other serum enzymes: Secondary | ICD-10-CM | POA: Diagnosis not present

## 2021-07-26 DIAGNOSIS — Z1389 Encounter for screening for other disorder: Secondary | ICD-10-CM | POA: Diagnosis not present

## 2021-07-26 DIAGNOSIS — E786 Lipoprotein deficiency: Secondary | ICD-10-CM | POA: Diagnosis not present

## 2021-07-26 DIAGNOSIS — Z0001 Encounter for general adult medical examination with abnormal findings: Secondary | ICD-10-CM | POA: Diagnosis not present

## 2021-08-08 ENCOUNTER — Ambulatory Visit: Payer: Medicare Other | Admitting: Pulmonary Disease

## 2021-08-19 ENCOUNTER — Other Ambulatory Visit: Payer: Self-pay

## 2021-08-19 ENCOUNTER — Ambulatory Visit (INDEPENDENT_AMBULATORY_CARE_PROVIDER_SITE_OTHER): Payer: Medicare Other | Admitting: Internal Medicine

## 2021-08-19 ENCOUNTER — Encounter: Payer: Self-pay | Admitting: Internal Medicine

## 2021-08-19 VITALS — BP 108/64 | HR 125 | Temp 97.9°F | Ht 68.0 in | Wt 135.4 lb

## 2021-08-19 DIAGNOSIS — J449 Chronic obstructive pulmonary disease, unspecified: Secondary | ICD-10-CM

## 2021-08-19 DIAGNOSIS — F17210 Nicotine dependence, cigarettes, uncomplicated: Secondary | ICD-10-CM

## 2021-08-19 MED ORDER — ALBUTEROL SULFATE (2.5 MG/3ML) 0.083% IN NEBU: 2.5000 mg | INHALATION_SOLUTION | Freq: Four times a day (QID) | RESPIRATORY_TRACT | 12 refills | Status: AC | PRN

## 2021-08-19 MED ORDER — STIOLTO RESPIMAT 2.5-2.5 MCG/ACT IN AERS: 2.0000 | INHALATION_SPRAY | Freq: Every day | RESPIRATORY_TRACT | 11 refills | Status: DC

## 2021-08-19 NOTE — Patient Instructions (Signed)
Please schedule follow up scheduled with myself in 6 months.  If my schedule is not open yet, we will contact you with a reminder closer to that time. Please call [phone] if you haven't heard from [place] a month before.  ? ?Take the albuterol rescue inhaler or nebulizer treatments every 4 to 6 hours as needed for wheezing or shortness of breath. ?You can also take it 15 minutes before exercise or exertional activity. ?Side effects include heart racing or pounding, jitters or anxiety. If you have a history of an irregular heart rhythm, it can make this worse. Can also give some patients a hard time sleeping. ? ?I am referring you to lung cancer screening program  ?

## 2021-08-19 NOTE — Progress Notes (Signed)
? ?      ?Joseph Gentry    [id number]    1950/09/20 ? ?Primary Care Physician:Fanta, Normajean Baxter, MD ?Date of Appointment: 08/19/2021 ?Established Patient Visit ? ?Chief complaint:   ?Chief Complaint  ?Patient presents with  ? Follow-up  ?  Pt states he has been doing okay since last visit. States that he has been coughing up more phlem than usual and states he will cough up white phlegm.  ? ? ? ?HPI: ?Joseph Gentry is a 71 y.o. man with COPD. Formerly seen by Dr. Carlis Abbott in 2021.  ? ?Interval Updates: ?Here for follow up with his daugter. ? Taking stiolto once daily. Has been seeing PCP in the interim for COPD. Ongoing work up for BPH/Prostate cancer as well as transaminitis.  ? ?No flares of COPD requiring prednisone and abx. No hospitalizations or ED visits. Continues to live independently without any difficulty completing ADLs. Minimal albuterol use.  ? ?Still smoking - down to about 5 cigarettes/day. Sometimes more or less depending on the day.  ? ? ?I have reviewed the patient's family social and past medical history and updated as appropriate.  ? ?Past Medical History:  ?Diagnosis Date  ? Alcoholism ([place])   ? Depression   ? GERD (gastroesophageal reflux disease)   ? Prostate cancer ([place])   ? ? ?Past Surgical History:  ?Procedure Laterality Date  ? NOSE SURGERY    ? ? ?Family History  ?Problem Relation Age of Onset  ? Prostate cancer Neg Hx   ? Colon cancer Neg Hx   ? Liver disease Neg Hx   ? ? ?Social History  ? ?Occupational History  ? Occupation: retired  ?  Comment: cotton [employer]  ?Tobacco Use  ? Smoking status: Every Day  ?  Packs/day: 1.00  ?  Years: 57.00  ?  Pack years: 57.00  ?  Types: Cigarettes  ?  Start date: 1966  ? Smokeless tobacco: Never  ? Tobacco comments:  ?  Currently smoking about 5cigs per day as of 08/19/21 ep  ?Vaping Use  ? Vaping Use: Never used  ?Substance and Sexual Activity  ? Alcohol use: Yes  ?  Alcohol/week: 9.0 standard drinks  ?  Types: 6 Cans of beer, 3 Standard drinks or  equivalent per week  ?  Comment: 6-7 beers daily  ? Drug use: Not Currently  ?  Types: Cocaine, Marijuana  ?  Comment: No IV or intravasal  ? Sexual activity: Not Currently  ? ? ? ?Physical Exam: ?Blood pressure 108/64, pulse (!) 125, temperature 97.9 ?F (36.6 ?C), temperature source Oral, height '5\' 8"'$  (1.727 m), weight 135 lb 6.4 oz (61.4 kg), SpO2 95 %. ? ?Gen:      No acute distress ?ENT:  no nasal polyps, mucus membranes moist ?Lungs:   Diminished bilaterally, No increased respiratory effort, symmetric chest wall excursion, clear to auscultation bilaterally, no wheezes or crackles ?CV:         tachycardic, regular, no murmurs, rubs, or gallops.  No pedal edema ? ? ?Data Reviewed: ?Imaging: ?I have personally reviewed the CT A/P done in March 2020 which shows emphysema in the lung bases.  ? ?PFTs: ? ?PFT Results Latest Ref Rng & Units 10/24/2019  ?FVC-Pre L 3.68  ?FVC-Predicted Pre % 104  ?FVC-Post L 3.69  ?FVC-Predicted Post % 104  ?Pre FEV1/FVC % % 56  ?Post FEV1/FCV % % 57  ?FEV1-Pre L 2.08  ?FEV1-Predicted Pre % 78  ?FEV1-Post L 2.11  ?  DLCO uncorrected ml/min/mmHg 11.16  ?DLCO UNC% % 46  ?DLVA Predicted % 46  ?TLC L 6.36  ?TLC % Predicted % 96  ?RV % Predicted % 98  ? ?I have personally reviewed the patient's PFTs and mild airflow limitation with reduced DLCO.  ? ?Labs: ? ?Immunization status: ?Immunization History  ?Administered Date(s) Administered  ? DTaP 10/10/2011  ? Influenza, High Dose Seasonal PF 02/22/2021  ? Influenza,inj,Quad PF,6-35 Mos 05/08/2019  ? PFIZER(Purple Top)SARS-COV-2 Vaccination 08/12/2019, 09/06/2019  ? ? ?External Records Personally Reviewed: PCP, pulmonary ? ?Assessment:  ?COPD with emphysema FEV1 78% of predicted ?Tobacco use disorder ?At risk for Lung Cancer, needs screening.  ? ?Plan/Recommendations: ?Continue stiolto with prn albuterol. Refilled.  ?Refer to lung cancer screening program.  ?Will get him a home nebulizer machine for breathing treatments as needed.  ? ?Smoking  Cessation Counseling: ? ?1. The patient is an everyday smoker and symptomatic due to the following condition copd ?2. The patient is currently pre-contemplative in quitting smoking. ?3. I advised patient to quit smoking. ?4. We identified patient specific barriers to change.  ?5. I personally spent 3  minutes counseling the patient regarding tobacco use disorder. ?6. We discussed management of stress and anxiety to help with smoking cessation, when applicable. ?7. We discussed nicotine replacement therapy, Wellbutrin, Chantix as possible options. ?8. I advised setting a quit date. ?9. Follow?up arranged with our office to continue ongoing discussions. ?10.Resources given to patient including quit hotline.  ? ? ?Return to Care: ?Return in about 6 months (around 02/19/2022). ? ? ?Lenice Llamas, MD ?Pulmonary and Critical Care Medicine ?[place] ?Office:[phone] ? ? ? ? ? ?

## 2021-08-23 DIAGNOSIS — M1990 Unspecified osteoarthritis, unspecified site: Secondary | ICD-10-CM | POA: Diagnosis not present

## 2021-09-10 DIAGNOSIS — J449 Chronic obstructive pulmonary disease, unspecified: Secondary | ICD-10-CM | POA: Diagnosis not present

## 2021-09-23 DIAGNOSIS — F17210 Nicotine dependence, cigarettes, uncomplicated: Secondary | ICD-10-CM | POA: Diagnosis not present

## 2021-09-23 DIAGNOSIS — J410 Simple chronic bronchitis: Secondary | ICD-10-CM | POA: Diagnosis not present

## 2021-10-07 ENCOUNTER — Other Ambulatory Visit: Payer: Self-pay

## 2021-10-07 DIAGNOSIS — Z87891 Personal history of nicotine dependence: Secondary | ICD-10-CM

## 2021-10-07 DIAGNOSIS — Z122 Encounter for screening for malignant neoplasm of respiratory organs: Secondary | ICD-10-CM

## 2021-10-07 DIAGNOSIS — F17210 Nicotine dependence, cigarettes, uncomplicated: Secondary | ICD-10-CM

## 2021-10-11 DIAGNOSIS — J449 Chronic obstructive pulmonary disease, unspecified: Secondary | ICD-10-CM | POA: Diagnosis not present

## 2021-10-23 DIAGNOSIS — K219 Gastro-esophageal reflux disease without esophagitis: Secondary | ICD-10-CM | POA: Diagnosis not present

## 2021-10-23 DIAGNOSIS — R3982 Chronic bladder pain: Secondary | ICD-10-CM | POA: Diagnosis not present

## 2021-10-29 ENCOUNTER — Other Ambulatory Visit: Payer: Medicare Other

## 2021-11-05 ENCOUNTER — Ambulatory Visit: Payer: Medicare Other | Admitting: Urology

## 2021-11-10 DIAGNOSIS — J449 Chronic obstructive pulmonary disease, unspecified: Secondary | ICD-10-CM | POA: Diagnosis not present

## 2021-11-15 ENCOUNTER — Other Ambulatory Visit: Payer: Medicare Other

## 2021-11-15 DIAGNOSIS — C61 Malignant neoplasm of prostate: Secondary | ICD-10-CM

## 2021-11-16 LAB — PSA: Prostate Specific Ag, Serum: 0.2 ng/mL (ref 0.0–4.0)

## 2021-11-18 ENCOUNTER — Ambulatory Visit (INDEPENDENT_AMBULATORY_CARE_PROVIDER_SITE_OTHER): Payer: Medicare Other | Admitting: Gastroenterology

## 2021-11-19 ENCOUNTER — Encounter: Payer: Self-pay | Admitting: Acute Care

## 2021-11-19 ENCOUNTER — Ambulatory Visit (INDEPENDENT_AMBULATORY_CARE_PROVIDER_SITE_OTHER): Payer: Medicare Other | Admitting: Acute Care

## 2021-11-19 ENCOUNTER — Encounter: Payer: Medicare Other | Admitting: Acute Care

## 2021-11-19 DIAGNOSIS — F17210 Nicotine dependence, cigarettes, uncomplicated: Secondary | ICD-10-CM | POA: Diagnosis not present

## 2021-11-19 NOTE — Patient Instructions (Signed)
Thank you for participating in the [facility]. It was our pleasure to meet you today. We will call you with the results of your scan within the next few days. Your scan will be assigned a Lung RADS category score by the physicians reading the scans.  This Lung RADS score determines follow up scanning.  See below for description of categories, and follow up screening recommendations. We will be in touch to schedule your follow up screening annually or based on recommendations of our providers. We will fax a copy of your scan results to your Primary Care Physician, or the physician who referred you to the program, to ensure they have the results. Please call the office if you have any questions or concerns regarding your scanning experience or results.  Our office number is [phone]. Please speak with Denise Phelps, RN. , or  Denise Buckner RN, They are  our Lung Cancer Screening RN.'s If They are unavailable when you call, Please leave a message on the voice mail. We will return your call at our earliest convenience.This voice mail is monitored several times a day.  Remember, if your scan is normal, we will scan you annually as long as you continue to meet the criteria for the program. (Age 55-77, Current smoker or smoker who has quit within the last 15 years). If you are a smoker, remember, quitting is the single most powerful action that you can take to decrease your risk of lung cancer and other pulmonary, breathing related problems. We know quitting is hard, and we are here to help.  Please let us know if there is anything we can do to help you meet your goal of quitting. If you are a former smoker, congratulations. We are proud of you! Remain smoke free! Remember you can refer friends or family members through the number above.  We will screen them to make sure they meet criteria for the program. Thank you for helping us take better care of you by  participating in Lung Screening.  You can receive free nicotine replacement therapy ( patches, gum or mints) by calling 1-800-QUIT NOW. Please call so we can get you on the path to becoming  a non-smoker. I know it is hard, but you can do this!  Lung RADS Categories:  Lung RADS 1: no nodules or definitely non-concerning nodules.  Recommendation is for a repeat annual scan in 12 months.  Lung RADS 2:  nodules that are non-concerning in appearance and behavior with a very low likelihood of becoming an active cancer. Recommendation is for a repeat annual scan in 12 months.  Lung RADS 3: nodules that are probably non-concerning , includes nodules with a low likelihood of becoming an active cancer.  Recommendation is for a 6-month repeat screening scan. Often noted after an upper respiratory illness. We will be in touch to make sure you have no questions, and to schedule your 6-month scan.  Lung RADS 4 A: nodules with concerning findings, recommendation is most often for a follow up scan in 3 months or additional testing based on our provider's assessment of the scan. We will be in touch to make sure you have no questions and to schedule the recommended 3 month follow up scan.  Lung RADS 4 B:  indicates findings that are concerning. We will be in touch with you to schedule additional diagnostic testing based on our provider's  assessment of the scan.  Other options for assistance in smoking cessation (   As covered by your insurance benefits)  Hypnosis for smoking cessation  [employer] [phone]  Acupuncture for smoking cessation  [facility] [phone]   

## 2021-11-19 NOTE — Progress Notes (Signed)
Virtual Visit via Telephone Note  I connected with Joseph Gentry on 11/19/21 at  2:00 PM EDT by telephone and verified that I am speaking with the correct person using two identifiers.  Location: Patient: At home Provider: [place], [place], [place], [address]    I discussed the limitations, risks, security and privacy concerns of performing an evaluation and management service by telephone and the availability of in person appointments. I also discussed with the patient that there may be a patient responsible charge related to this service. The patient expressed understanding and agreed to proceed.     Shared Decision Making Visit Lung Cancer Screening Program [phone])   Eligibility: Age 71 y.o. Pack Years Smoking History Calculation 25 pack year smoking history (# packs/per year x # years smoked) Recent History of coughing up blood  no Unexplained weight loss? no ( >Than 15 pounds within the last 6 months ) Prior History Lung / other cancer no (Diagnosis within the last 5 years already requiring surveillance chest CT Scans). Smoking Status Current Smoker Former Smokers: Years since quit:  NA  Quit Date:  NA  Visit Components: Discussion included one or more decision making aids. yes Discussion included risk/benefits of screening. yes Discussion included potential follow up diagnostic testing for abnormal scans. yes Discussion included meaning and risk of over diagnosis. yes Discussion included meaning and risk of False Positives. yes Discussion included meaning of total radiation exposure. yes  Counseling Included: Importance of adherence to annual lung cancer LDCT screening. yes Impact of comorbidities on ability to participate in the program. yes Ability and willingness to under diagnostic treatment. yes  Smoking Cessation Counseling: Current Smokers:  Discussed importance of smoking cessation. yes Information about tobacco cessation classes and  interventions provided to patient. yes Patient provided with "ticket" for LDCT Scan. yes Symptomatic Patient. no  Counseling NA Diagnosis Code: Tobacco Use Z72.0 Asymptomatic Patient yes  Counseling (Intermediate counseling: > three minutes counseling) [id number] Former Smokers:  Discussed the importance of maintaining cigarette abstinence. yes Diagnosis Code: Personal History of Nicotine Dependence. W29.562 Information about tobacco cessation classes and interventions provided to patient. Yes Patient provided with "ticket" for LDCT Scan. yes Written Order for Lung Cancer Screening with LDCT placed in Epic. Yes (CT Chest Lung Cancer Screening Low Dose W/O CM) [id number] Z12.2-Screening of respiratory organs Z87.891-Personal history of nicotine dependence  I have spent 25 minutes of face to face/ virtual visit   time with  Joseph Gentry and his daughter Joseph Gentry  discussing the risks and benefits of lung cancer screening. We viewed / discussed a power point together that explained in detail the above noted topics. We paused at intervals to allow for questions to be asked and answered to ensure understanding.We discussed that the single most powerful action that he can take to decrease his risk of developing lung cancer is to quit smoking. We discussed whether or not he is ready to commit to setting a quit date. We discussed options for tools to aid in quitting smoking including nicotine replacement therapy, non-nicotine medications, support groups, Quit Smart classes, and behavior modification. We discussed that often times setting smaller, more achievable goals, such as eliminating 1 cigarette a day for a week and then 2 cigarettes a day for a week can be helpful in slowly decreasing the number of cigarettes smoked. This allows for a sense of accomplishment as well as providing a clinical benefit. I provided  him  with smoking cessation  information  with contact information for community resources,  classes, free nicotine replacement therapy, and access to mobile apps, text messaging, and on-line smoking cessation help. I have also provided  him  the office contact information in the event he needs to contact me, or the screening staff. We discussed the time and location of the scan, and that either Doroteo Glassman RN, Joella Prince, RN  or I will call / send a letter with the results within 24-72 hours of receiving them. The patient verbalized understanding of all of  the above and had no further questions upon leaving the office. They have my contact information in the event they have any further questions.  I spent 3 minutes counseling on smoking cessation and the health risks of continued tobacco abuse.  I explained to the patient that there has been a high incidence of coronary artery disease noted on these exams. I explained that this is a non-gated exam therefore degree or severity cannot be determined. This patient is not on statin therapy. I have asked the patient to follow-up with their PCP regarding any incidental finding of coronary artery disease and management with diet or medication as their PCP  feels is clinically indicated. The patient verbalized understanding of the above and had no further questions upon completion of the visit.      Magdalen Spatz, NP 11/19/2021

## 2021-11-20 ENCOUNTER — Ambulatory Visit (HOSPITAL_COMMUNITY)
Admission: RE | Admit: 2021-11-20 | Discharge: 2021-11-20 | Disposition: A | Discharge status: Home / Self Care | Payer: Medicare Other | Source: Ambulatory Visit | Attending: Acute Care | Admitting: Acute Care

## 2021-11-20 ENCOUNTER — Ambulatory Visit (INDEPENDENT_AMBULATORY_CARE_PROVIDER_SITE_OTHER): Payer: Medicare Other | Admitting: Urology

## 2021-11-20 ENCOUNTER — Encounter: Payer: Self-pay | Admitting: Urology

## 2021-11-20 VITALS — BP 113/73 | HR 94 | Wt 132.0 lb

## 2021-11-20 DIAGNOSIS — R3912 Poor urinary stream: Secondary | ICD-10-CM

## 2021-11-20 DIAGNOSIS — N138 Other obstructive and reflux uropathy: Secondary | ICD-10-CM

## 2021-11-20 DIAGNOSIS — Z87891 Personal history of nicotine dependence: Secondary | ICD-10-CM | POA: Diagnosis not present

## 2021-11-20 DIAGNOSIS — Z122 Encounter for screening for malignant neoplasm of respiratory organs: Secondary | ICD-10-CM | POA: Insufficient documentation

## 2021-11-20 DIAGNOSIS — C61 Malignant neoplasm of prostate: Secondary | ICD-10-CM

## 2021-11-20 DIAGNOSIS — F17210 Nicotine dependence, cigarettes, uncomplicated: Secondary | ICD-10-CM | POA: Diagnosis not present

## 2021-11-20 DIAGNOSIS — N401 Enlarged prostate with lower urinary tract symptoms: Secondary | ICD-10-CM

## 2021-11-20 MED ORDER — TAMSULOSIN HCL 0.4 MG PO CAPS
0.4000 mg | ORAL_CAPSULE | Freq: Two times a day (BID) | ORAL | 3 refills | Status: DC
Start: 2021-11-20 — End: 2022-09-26

## 2021-11-20 NOTE — Patient Instructions (Signed)

## 2021-11-20 NOTE — Progress Notes (Signed)
11/20/2021 1:12 PM   Joseph Gentry 01/18/1951 [id number]  Referring provider: Carrolyn Meiers, MD [place],  [place] [address]  Followup Prostate cancer and BPH   HPI: [name] is a 71yo here for followup for BPH and prostate cancer. IPSS 17 QOL 3 on flomax 0.'4mg'$  daily. Nocturia 1-2x. Urine stream intermittently weak. He has occasional urgency.  PSA increased to 0.2 from 0.1.  no other complaints today   PMH: Past Medical History:  Diagnosis Date   Alcoholism ([place])    Depression    GERD (gastroesophageal reflux disease)    Prostate cancer [facility])     Surgical History: Past Surgical History:  Procedure Laterality Date   NOSE SURGERY      Home Medications:  Allergies as of 11/20/2021   No Known Allergies      Medication List        Accurate as of November 20, 2021  1:12 PM. If you have any questions, ask your nurse or doctor.          albuterol (2.5 MG/3ML) 0.083% nebulizer solution Commonly known as: PROVENTIL Take 3 mLs (2.5 mg total) by nebulization every 6 (six) hours as needed for wheezing or shortness of breath. What changed: Another medication with the same name was removed. Continue taking this medication, and follow the directions you see here.   cetirizine 10 MG tablet Commonly known as: ZYRTEC Take 1 tablet (10 mg total) by mouth daily. What changed:  when to take this reasons to take this   FeroSul 325 (65 FE) MG tablet Generic drug: ferrous sulfate Take 325 mg by mouth daily.   fluticasone 50 MCG/ACT nasal spray Commonly known as: FLONASE Place 2 sprays into both nostrils daily. What changed:  when to take this reasons to take this   mirtazapine 15 MG tablet Commonly known as: REMERON Take 15 mg by mouth at bedtime.   multivitamin with minerals Tabs tablet Take 1 tablet by mouth daily.   omeprazole 40 MG capsule Commonly known as: PRILOSEC Take 40 mg by mouth daily.   Stiolto Respimat 2.5-2.5 MCG/ACT  Aers Generic drug: Tiotropium Bromide-Olodaterol Inhale 2 puffs into the lungs daily.   tamsulosin 0.4 MG Caps capsule Commonly known as: FLOMAX Take 1 capsule (0.4 mg total) by mouth daily.   traMADol 50 MG tablet Commonly known as: ULTRAM Take 1 tablet (50 mg total) by mouth every 6 (six) hours as needed.        Allergies: No Known Allergies  Family History: Family History  Problem Relation Age of Onset   Prostate cancer Neg Hx    Colon cancer Neg Hx    Liver disease Neg Hx     Social History:  reports that he has been smoking cigarettes. He started smoking about 57 years ago. He has a 57.00 pack-year smoking history. He has never used smokeless tobacco. He reports current alcohol use of about 9.0 standard drinks per week. He reports that he does not currently use drugs after having used the following drugs: Cocaine and Marijuana.  ROS: All other review of systems were reviewed and are negative except what is noted above in HPI  Physical Exam: BP 113/73   Pulse 94   Wt 132 lb (59.9 kg)   BMI 20.07 kg/m   Constitutional:  Alert and oriented, No acute distress. HEENT: [place] AT, moist mucus membranes.  Trachea midline, no masses. Cardiovascular: No clubbing, cyanosis, or edema. Respiratory: Normal respiratory effort, no increased  work of breathing. GI: Abdomen is soft, nontender, nondistended, no abdominal masses GU: No CVA tenderness.  Lymph: No cervical or inguinal lymphadenopathy. Skin: No rashes, bruises or suspicious lesions. Neurologic: Grossly intact, no focal deficits, moving all 4 extremities. Psychiatric: Normal mood and affect.  Laboratory Data: Lab Results  Component Value Date   WBC 6.4 12/18/2015   HGB 15.6 12/18/2015   HCT 46.0 12/18/2015   MCV 100.3 (H) 12/18/2015   PLT 229 12/18/2015    Lab Results  Component Value Date   CREATININE 1.00 12/18/2015    Lab Results  Component Value Date   PSA <0.1 08/19/2019    Lab Results  Component  Value Date   TESTOSTERONE 54 (L) 08/19/2019    No results found for: HGBA1C  Urinalysis No results found for: COLORURINE, APPEARANCEUR, LABSPEC, PHURINE, GLUCOSEU, HGBUR, BILIRUBINUR, KETONESUR, PROTEINUR, UROBILINOGEN, NITRITE, LEUKOCYTESUR  No results found for: LABMICR, [place], RBCUA, LABEPIT, MUCUS, BACTERIA  Pertinent Imaging:  No results found for this or any previous visit.  No results found for this or any previous visit.  No results found for this or any previous visit.  No results found for this or any previous visit.  Results for orders placed in visit on 04/26/21  Ultrasound renal complete  Narrative CLINICAL DATA:  Left flank pain.  EXAM: RENAL / URINARY TRACT ULTRASOUND COMPLETE  COMPARISON:  None.  FINDINGS: Right Kidney:  Renal measurements: 9.3 x 4.0 x 4.9 cm = volume: 94 mL. Echogenicity within normal limits. No mass or hydronephrosis visualized.  Left Kidney:  Renal measurements: 9.8 x 5.9 x 4.6 cm = volume: 140 mL. Echogenicity within normal limits. No mass or hydronephrosis visualized.  Bladder:  Appears normal for degree of bladder distention.  Other:  There is diffuse increased liver echogenicity most commonly seen in the setting of fatty infiltration. Superimposed inflammation or fibrosis is not excluded. Clinical correlation is recommended.  IMPRESSION: 1. Unremarkable kidneys and urinary bladder. 2. Fatty liver.   Electronically Signed By: Anner Crete M.D. On: 05/09/2021 01:54  No results found for this or any previous visit.  No results found for this or any previous visit.  No results found for this or any previous visit.   Assessment & Plan:    1. Benign prostatic hyperplasia with urinary obstruction -Increase flomax to BID  2. Prostate cancer ([place]) -RTC 6 months with PSA  3. Weak urinary stream -increase flomax to BID   No follow-ups on file.  Nicolette Bang, MD  [facility] [place]

## 2021-11-22 ENCOUNTER — Other Ambulatory Visit: Payer: Self-pay

## 2021-11-22 DIAGNOSIS — Z87891 Personal history of nicotine dependence: Secondary | ICD-10-CM

## 2021-11-22 DIAGNOSIS — F17210 Nicotine dependence, cigarettes, uncomplicated: Secondary | ICD-10-CM

## 2021-11-22 DIAGNOSIS — Z122 Encounter for screening for malignant neoplasm of respiratory organs: Secondary | ICD-10-CM

## 2021-11-23 DIAGNOSIS — J439 Emphysema, unspecified: Secondary | ICD-10-CM | POA: Diagnosis not present

## 2021-11-23 DIAGNOSIS — M1990 Unspecified osteoarthritis, unspecified site: Secondary | ICD-10-CM | POA: Diagnosis not present

## 2021-12-06 DIAGNOSIS — H5213 Myopia, bilateral: Secondary | ICD-10-CM | POA: Diagnosis not present

## 2021-12-11 DIAGNOSIS — J449 Chronic obstructive pulmonary disease, unspecified: Secondary | ICD-10-CM | POA: Diagnosis not present

## 2021-12-23 DIAGNOSIS — K219 Gastro-esophageal reflux disease without esophagitis: Secondary | ICD-10-CM | POA: Diagnosis not present

## 2021-12-23 DIAGNOSIS — M1990 Unspecified osteoarthritis, unspecified site: Secondary | ICD-10-CM | POA: Diagnosis not present

## 2022-01-10 DIAGNOSIS — J449 Chronic obstructive pulmonary disease, unspecified: Secondary | ICD-10-CM | POA: Diagnosis not present

## 2022-01-13 ENCOUNTER — Ambulatory Visit (INDEPENDENT_AMBULATORY_CARE_PROVIDER_SITE_OTHER): Payer: Medicare Other | Admitting: Gastroenterology

## 2022-01-13 ENCOUNTER — Encounter (INDEPENDENT_AMBULATORY_CARE_PROVIDER_SITE_OTHER): Payer: Self-pay | Admitting: Gastroenterology

## 2022-01-13 VITALS — BP 115/79 | HR 98 | Temp 97.7°F | Ht 68.0 in | Wt 138.1 lb

## 2022-01-13 DIAGNOSIS — F1020 Alcohol dependence, uncomplicated: Secondary | ICD-10-CM | POA: Diagnosis not present

## 2022-01-13 DIAGNOSIS — R7989 Other specified abnormal findings of blood chemistry: Secondary | ICD-10-CM

## 2022-01-13 DIAGNOSIS — R109 Unspecified abdominal pain: Secondary | ICD-10-CM | POA: Diagnosis not present

## 2022-01-13 DIAGNOSIS — D509 Iron deficiency anemia, unspecified: Secondary | ICD-10-CM | POA: Diagnosis not present

## 2022-01-13 DIAGNOSIS — Z1159 Encounter for screening for other viral diseases: Secondary | ICD-10-CM | POA: Diagnosis not present

## 2022-01-13 NOTE — Patient Instructions (Addendum)
Alcohol cessation - it is imperative to stop drinking alcohol (including beer and seltzers). May benefit from reaching support groups like AA - Check hepatitis A/B/C serologies, iron panel, ANA, ASMA, IgG Schedule liver elastography

## 2022-01-13 NOTE — Progress Notes (Signed)
Joseph Gentry, M.D. Gastroenterology & Hepatology [facility] [address] [place], [name] [address] Primary Care Physician: Carrolyn Meiers, MD [address] [place] [address]  Referring MD: PCP  Chief Complaint:  elevated LFTs  History of Present Illness: Joseph Gentry is a 71 y.o. male with past medical history of depression, alcohol abuse, GERD, prostate cancer status post radiation therapy, who presents for evaluation of elevated LFTs.  The patient was referred to our office for further evaluation of elevated LFTs found on work-up performed on 07/26/2021.  Labs showed a creatinine of 0.74, albumin 4.1, total bilirubin 1.3, direct bilirubin 0.3, alkaline phosphatase 174, AST 38, ALT 31, hemoglobin 18.0, hematocrit 51.1 MCV 100.  Patient reports drinking beer on a daily basis for multiple years. States he has been drinking alcohol more regularly. He used to drink more liquor in the past, now he drinks mostly beer. He states drinking since he was a teenager. Now he drinks at least 6 beers daily.  Notably, the patient was being followed at [facility] in the past due to elevated LFTs.  As part of the work-up he had performed in March 2022, he had elevated iron stores of 205, iron saturation 54%, ferritin of 234, negative antimitochondrial, anti-smooth antibody, negative hepatitis C antibody.  He had a liver ultrasound on 08/27/2020 which showed hepatic steatosis but no other findings.  The plan plan was to repeat LFTs and to have repeat hemochromatosis genetic markers checked, however patient did not follow-up at this office.  He denies any complaints. He noticed some abdominal distention for the last 2 years since he had prostate cancer and had radiation therapy.  The patient denies having any nausea, vomiting, fever, chills, hematochezia, melena, hematemesis, abdominal distention, abdominal pain, diarrhea, jaundice, pruritus  or weight loss.  FHx: Patient denies gastrointestinal/liver disease, no malignancies however, per previous notes father may have had liver disease Social: smokes 5-10 cigs per day, cocaine in the past - snorted, stopped 10 years ago Surgical: no abdominal surgeries  Last EGD: possibly 2 years ago, no records available Last Colonoscopy: possibly 2 years ago, no records available but performed in [place] per patient, states he had a couple of polyps.  Past Medical History: Past Medical History:  Diagnosis Date   Alcoholism ([place])    Depression    GERD (gastroesophageal reflux disease)    Prostate cancer ([place])     Past Surgical History: Past Surgical History:  Procedure Laterality Date   NOSE SURGERY      Family History: Family History  Problem Relation Age of Onset   Prostate cancer Neg Hx    Colon cancer Neg Hx    Liver disease Neg Hx     Social History: Social History   Tobacco Use  Smoking Status Every Day   Packs/day: 0.50   Years: 57.00   Total pack years: 28.50   Types: Cigarettes   Start date: 1966  Smokeless Tobacco Never  Tobacco Comments   Currently smoking about 5cigs per day as of 08/19/21 ep   Social History   Substance and Sexual Activity  Alcohol Use Yes   Alcohol/week: 9.0 standard drinks of alcohol   Types: 6 Cans of beer, 3 Standard drinks or equivalent per week   Comment: 6-7 beers daily   Social History   Substance and Sexual Activity  Drug Use Not Currently   Types: Cocaine, Marijuana   Comment: No IV or intravasal    Allergies:  No Known Allergies  Medications: Current Outpatient Medications  Medication Sig Dispense Refill   albuterol (PROVENTIL) (2.5 MG/3ML) 0.083% nebulizer solution Take 3 mLs (2.5 mg total) by nebulization every 6 (six) hours as needed for wheezing or shortness of breath. 75 mL 12   cetirizine (ZYRTEC) 10 MG tablet Take 1 tablet (10 mg total) by mouth daily. (Patient taking differently: Take 10 mg by mouth as  needed.) 30 tablet 11   fluticasone (FLONASE) 50 MCG/ACT nasal spray Place 2 sprays into both nostrils daily. (Patient taking differently: Place 2 sprays into both nostrils as needed.) 16 g 11   mirtazapine (REMERON) 15 MG tablet Take 15 mg by mouth at bedtime.     omeprazole (PRILOSEC) 40 MG capsule Take 40 mg by mouth daily.     tamsulosin (FLOMAX) 0.4 MG CAPS capsule Take 1 capsule (0.4 mg total) by mouth 2 (two) times daily. 180 capsule 3   Tiotropium Bromide-Olodaterol (STIOLTO RESPIMAT) 2.5-2.5 MCG/ACT AERS Inhale 2 puffs into the lungs daily. 4 g 11   No current facility-administered medications for this visit.    Review of Systems: GENERAL: negative for malaise, night sweats HEENT: No changes in hearing or vision, no nose bleeds or other nasal problems. NECK: Negative for lumps, goiter, pain and significant neck swelling RESPIRATORY: Negative for cough, wheezing CARDIOVASCULAR: Negative for chest pain, leg swelling, palpitations, orthopnea GI: SEE HPI MUSCULOSKELETAL: Negative for joint pain or swelling, back pain, and muscle pain. SKIN: Negative for lesions, rash PSYCH: Negative for sleep disturbance, mood disorder and recent psychosocial stressors. HEMATOLOGY Negative for prolonged bleeding, bruising easily, and swollen nodes. ENDOCRINE: Negative for cold or heat intolerance, polyuria, polydipsia and goiter. NEURO: negative for tremor, gait imbalance, syncope and seizures. The remainder of the review of systems is noncontributory.   Physical Exam: BP 115/79 (BP Location: Left Arm, Patient Position: Sitting, Cuff Size: Small)   Pulse 98   Temp 97.7 F (36.5 C) (Oral)   Ht '5\' 8"'$  (1.727 m)   Wt 138 lb 1.6 oz (62.6 kg)   BMI 21.00 kg/m  GENERAL: The patient is AO x3, in no acute distress. HEENT: Head is normocephalic and atraumatic. EOMI are intact. Mouth is well hydrated and without lesions. NECK: Supple. No masses LUNGS: Clear to auscultation. No presence of  rhonchi/wheezing/rales. Adequate chest expansion HEART: RRR, normal s1 and s2. ABDOMEN: Soft, nontender, no guarding, no peritoneal signs, and nondistended. BS +. No masses. EXTREMITIES: Without any cyanosis, clubbing, rash, lesions or edema. NEUROLOGIC: AOx3, no focal motor deficit. SKIN: no jaundice, no rashes   Imaging/Labs: as above  I personally reviewed and interpreted the available labs, imaging and endoscopic files.  Impression and Plan: Joseph Gentry is a 71 y.o. male with past medical history of depression, alcohol abuse, GERD, prostate cancer status post radiation therapy, who presents for evaluation of elevated LFTs.  Patient has presented mild elevation of his aminotransferases, presenting a predominant elevation of AST over ALT.  I feel that given his heavy alcohol intake, a large part of his current abnormalities are related to alcohol abuse.  We had a thorough discussion with the family member and the patient regarding the importance of alcohol cessation to avoid further liver damage and progression to liver cirrhosis.  I explained that it was still possible he is presenting some degree of cirrhosis which we will need to evaluate further with an elastography.  As he had some elevated iron stores but with normal ferritin, we will recheck his iron stores  today and if persistently elevated we will proceed with genetic testing for hemochromatosis.  We will also look for autoimmune etiologies and viral hepatitis, although at this is possibly less likely.  - Alcohol cessation - it is imperative to stop drinking alcohol (including beer and seltzers). Will benefit from reaching support groups like AA - Check hepatitis A/B/C serologies, iron panel, ANA, ASMA, IgG - Schedule liver elastography  All questions were answered.      Joseph Peppers, MD Gastroenterology and Hepatology [facility]

## 2022-01-15 ENCOUNTER — Ambulatory Visit (HOSPITAL_COMMUNITY): Admission: RE | Admit: 2022-01-15 | Payer: Medicare Other | Source: Ambulatory Visit

## 2022-01-16 LAB — IRON,TIBC AND FERRITIN PANEL
%SAT: 33 % (calc) (ref 20–48)
Ferritin: 503 ng/mL — ABNORMAL HIGH (ref 24–380)
Iron: 101 ug/dL (ref 50–180)
TIBC: 304 mcg/dL (calc) (ref 250–425)

## 2022-01-16 LAB — ANA: Anti Nuclear Antibody (ANA): POSITIVE — AB

## 2022-01-16 LAB — HEPATITIS PANEL, ACUTE
Hep A IgM: NONREACTIVE
Hep B C IgM: NONREACTIVE
Hepatitis B Surface Ag: NONREACTIVE
Hepatitis C Ab: NONREACTIVE

## 2022-01-16 LAB — ANTI-NUCLEAR AB-TITER (ANA TITER): ANA Titer 1: 1:40 {titer} — ABNORMAL HIGH

## 2022-01-16 LAB — IGG: IgG (Immunoglobin G), Serum: 841 mg/dL (ref 600–1540)

## 2022-01-16 LAB — ANTI-SMOOTH MUSCLE ANTIBODY, IGG: Actin (Smooth Muscle) Antibody (IGG): <20 U (ref <20)

## 2022-01-17 ENCOUNTER — Other Ambulatory Visit (INDEPENDENT_AMBULATORY_CARE_PROVIDER_SITE_OTHER): Payer: Self-pay | Admitting: Gastroenterology

## 2022-01-17 DIAGNOSIS — J439 Emphysema, unspecified: Secondary | ICD-10-CM | POA: Diagnosis not present

## 2022-01-17 DIAGNOSIS — F17210 Nicotine dependence, cigarettes, uncomplicated: Secondary | ICD-10-CM | POA: Diagnosis not present

## 2022-01-17 DIAGNOSIS — R3982 Chronic bladder pain: Secondary | ICD-10-CM | POA: Diagnosis not present

## 2022-01-17 DIAGNOSIS — R7989 Other specified abnormal findings of blood chemistry: Secondary | ICD-10-CM

## 2022-01-17 DIAGNOSIS — K219 Gastro-esophageal reflux disease without esophagitis: Secondary | ICD-10-CM | POA: Diagnosis not present

## 2022-01-24 ENCOUNTER — Ambulatory Visit (HOSPITAL_COMMUNITY): Payer: Medicare Other

## 2022-02-03 ENCOUNTER — Ambulatory Visit (HOSPITAL_COMMUNITY)
Admission: RE | Admit: 2022-02-03 | Discharge: 2022-02-03 | Disposition: A | Discharge status: Home / Self Care | Payer: Medicare Other | Source: Ambulatory Visit | Attending: Gastroenterology | Admitting: Gastroenterology

## 2022-02-03 ENCOUNTER — Other Ambulatory Visit (INDEPENDENT_AMBULATORY_CARE_PROVIDER_SITE_OTHER): Payer: Self-pay | Admitting: Gastroenterology

## 2022-02-03 ENCOUNTER — Encounter (INDEPENDENT_AMBULATORY_CARE_PROVIDER_SITE_OTHER): Payer: Self-pay | Admitting: Gastroenterology

## 2022-02-03 DIAGNOSIS — R7989 Other specified abnormal findings of blood chemistry: Secondary | ICD-10-CM | POA: Diagnosis not present

## 2022-02-03 DIAGNOSIS — D649 Anemia, unspecified: Secondary | ICD-10-CM

## 2022-02-03 DIAGNOSIS — D509 Iron deficiency anemia, unspecified: Secondary | ICD-10-CM

## 2022-02-03 DIAGNOSIS — R945 Abnormal results of liver function studies: Secondary | ICD-10-CM | POA: Diagnosis not present

## 2022-02-03 NOTE — Telephone Encounter (Signed)
Ordered by Dr. Jenetta Downer and daughter advised that she will need to have pcp follow the results and possibly a hematologist if needed. Daughter states understanding.

## 2022-02-10 DIAGNOSIS — J449 Chronic obstructive pulmonary disease, unspecified: Secondary | ICD-10-CM | POA: Diagnosis not present

## 2022-02-11 LAB — THALASSEMIA AND HEMOGLOBINOPATHY COMPREHENSIVE EVALUATION
Ferritin: 361 ng/mL (ref 24–380)
Fetal Hemoglobin Testing: 0 % (ref <2.0)
HCT: 51.1 % — ABNORMAL HIGH (ref 38.5–50.0)
Hemoglobin A2 - HGBRFX: 2.6 % (ref 2.2–3.2)
Hemoglobin: 17.6 g/dL — ABNORMAL HIGH (ref 13.2–17.1)
Hgb A: 97.4 % (ref >96.0)
MCH: 34.6 pg — ABNORMAL HIGH (ref 27.0–33.0)
MCHC: 34.4 g/dL (ref 32.0–36.0)
MCV: 100.4 fL — ABNORMAL HIGH (ref 80.0–100.0)
RBC: 5.09 10*6/uL (ref 4.20–5.80)
RDW: 12.6 % (ref 11.0–15.0)

## 2022-02-11 LAB — HEMOCHROMATOSIS DNA-PCR(C282Y,H63D)

## 2022-02-17 DIAGNOSIS — I700 Atherosclerosis of aorta: Secondary | ICD-10-CM | POA: Diagnosis not present

## 2022-02-17 DIAGNOSIS — J410 Simple chronic bronchitis: Secondary | ICD-10-CM | POA: Diagnosis not present

## 2022-02-20 ENCOUNTER — Ambulatory Visit: Payer: Medicare Other | Admitting: Internal Medicine

## 2022-03-10 ENCOUNTER — Encounter (INDEPENDENT_AMBULATORY_CARE_PROVIDER_SITE_OTHER): Payer: Self-pay

## 2022-03-10 DIAGNOSIS — Z1159 Encounter for screening for other viral diseases: Secondary | ICD-10-CM | POA: Insufficient documentation

## 2022-03-13 DIAGNOSIS — J449 Chronic obstructive pulmonary disease, unspecified: Secondary | ICD-10-CM | POA: Diagnosis not present

## 2022-03-19 DIAGNOSIS — J410 Simple chronic bronchitis: Secondary | ICD-10-CM | POA: Diagnosis not present

## 2022-03-19 DIAGNOSIS — R748 Abnormal levels of other serum enzymes: Secondary | ICD-10-CM | POA: Diagnosis not present

## 2022-04-12 DIAGNOSIS — J449 Chronic obstructive pulmonary disease, unspecified: Secondary | ICD-10-CM | POA: Diagnosis not present

## 2022-04-19 DIAGNOSIS — J410 Simple chronic bronchitis: Secondary | ICD-10-CM | POA: Diagnosis not present

## 2022-04-19 DIAGNOSIS — K219 Gastro-esophageal reflux disease without esophagitis: Secondary | ICD-10-CM | POA: Diagnosis not present

## 2022-04-25 ENCOUNTER — Ambulatory Visit: Payer: Medicare Other | Admitting: Internal Medicine

## 2022-05-13 DIAGNOSIS — J449 Chronic obstructive pulmonary disease, unspecified: Secondary | ICD-10-CM | POA: Diagnosis not present

## 2022-05-15 ENCOUNTER — Other Ambulatory Visit: Payer: Medicare Other

## 2022-05-19 DIAGNOSIS — K219 Gastro-esophageal reflux disease without esophagitis: Secondary | ICD-10-CM | POA: Diagnosis not present

## 2022-05-19 DIAGNOSIS — J439 Emphysema, unspecified: Secondary | ICD-10-CM | POA: Diagnosis not present

## 2022-05-23 ENCOUNTER — Ambulatory Visit: Payer: Medicare Other | Admitting: Urology

## 2022-06-03 ENCOUNTER — Ambulatory Visit: Payer: Medicare Other | Admitting: Urology

## 2022-06-03 DIAGNOSIS — C61 Malignant neoplasm of prostate: Secondary | ICD-10-CM

## 2022-06-03 DIAGNOSIS — N401 Enlarged prostate with lower urinary tract symptoms: Secondary | ICD-10-CM

## 2022-06-12 DIAGNOSIS — J449 Chronic obstructive pulmonary disease, unspecified: Secondary | ICD-10-CM | POA: Diagnosis not present

## 2022-06-19 DIAGNOSIS — K219 Gastro-esophageal reflux disease without esophagitis: Secondary | ICD-10-CM | POA: Diagnosis not present

## 2022-06-19 DIAGNOSIS — J439 Emphysema, unspecified: Secondary | ICD-10-CM | POA: Diagnosis not present

## 2022-07-04 ENCOUNTER — Ambulatory Visit: Payer: 59 | Admitting: Urology

## 2022-07-17 ENCOUNTER — Ambulatory Visit (INDEPENDENT_AMBULATORY_CARE_PROVIDER_SITE_OTHER): Payer: 59 | Admitting: Gastroenterology

## 2022-07-31 ENCOUNTER — Ambulatory Visit (HOSPITAL_COMMUNITY)
Admission: RE | Admit: 2022-07-31 | Discharge: 2022-07-31 | Disposition: A | Discharge status: Home / Self Care | Payer: 59 | Source: Ambulatory Visit | Attending: Internal Medicine | Admitting: Internal Medicine

## 2022-07-31 DIAGNOSIS — I517 Cardiomegaly: Secondary | ICD-10-CM | POA: Insufficient documentation

## 2022-07-31 DIAGNOSIS — R000 Tachycardia, unspecified: Secondary | ICD-10-CM | POA: Diagnosis not present

## 2022-07-31 DIAGNOSIS — J410 Simple chronic bronchitis: Secondary | ICD-10-CM | POA: Diagnosis not present

## 2022-07-31 DIAGNOSIS — J439 Emphysema, unspecified: Secondary | ICD-10-CM | POA: Diagnosis not present

## 2022-07-31 DIAGNOSIS — K219 Gastro-esophageal reflux disease without esophagitis: Secondary | ICD-10-CM | POA: Diagnosis not present

## 2022-07-31 DIAGNOSIS — Z1389 Encounter for screening for other disorder: Secondary | ICD-10-CM | POA: Diagnosis not present

## 2022-07-31 DIAGNOSIS — F17208 Nicotine dependence, unspecified, with other nicotine-induced disorders: Secondary | ICD-10-CM | POA: Diagnosis not present

## 2022-07-31 DIAGNOSIS — F17210 Nicotine dependence, cigarettes, uncomplicated: Secondary | ICD-10-CM | POA: Diagnosis not present

## 2022-07-31 DIAGNOSIS — M130 Polyarthritis, unspecified: Secondary | ICD-10-CM | POA: Diagnosis not present

## 2022-07-31 DIAGNOSIS — Z0001 Encounter for general adult medical examination with abnormal findings: Secondary | ICD-10-CM | POA: Diagnosis not present

## 2022-08-19 DIAGNOSIS — Z0001 Encounter for general adult medical examination with abnormal findings: Secondary | ICD-10-CM | POA: Diagnosis not present

## 2022-08-19 DIAGNOSIS — R748 Abnormal levels of other serum enzymes: Secondary | ICD-10-CM | POA: Diagnosis not present

## 2022-08-19 DIAGNOSIS — K219 Gastro-esophageal reflux disease without esophagitis: Secondary | ICD-10-CM | POA: Diagnosis not present

## 2022-08-29 DIAGNOSIS — J439 Emphysema, unspecified: Secondary | ICD-10-CM | POA: Diagnosis not present

## 2022-08-29 DIAGNOSIS — K219 Gastro-esophageal reflux disease without esophagitis: Secondary | ICD-10-CM | POA: Diagnosis not present

## 2022-09-15 ENCOUNTER — Other Ambulatory Visit: Payer: Self-pay | Admitting: Acute Care

## 2022-09-15 DIAGNOSIS — Z122 Encounter for screening for malignant neoplasm of respiratory organs: Secondary | ICD-10-CM

## 2022-09-15 DIAGNOSIS — F17210 Nicotine dependence, cigarettes, uncomplicated: Secondary | ICD-10-CM

## 2022-09-15 DIAGNOSIS — Z87891 Personal history of nicotine dependence: Secondary | ICD-10-CM

## 2022-09-26 ENCOUNTER — Ambulatory Visit (INDEPENDENT_AMBULATORY_CARE_PROVIDER_SITE_OTHER): Payer: 59 | Admitting: Urology

## 2022-09-26 VITALS — BP 127/86 | HR 120

## 2022-09-26 DIAGNOSIS — N401 Enlarged prostate with lower urinary tract symptoms: Secondary | ICD-10-CM | POA: Diagnosis not present

## 2022-09-26 DIAGNOSIS — C61 Malignant neoplasm of prostate: Secondary | ICD-10-CM | POA: Diagnosis not present

## 2022-09-26 DIAGNOSIS — N138 Other obstructive and reflux uropathy: Secondary | ICD-10-CM

## 2022-09-26 DIAGNOSIS — R3912 Poor urinary stream: Secondary | ICD-10-CM | POA: Diagnosis not present

## 2022-09-26 MED ORDER — TAMSULOSIN HCL 0.4 MG PO CAPS: 0.4000 mg | ORAL_CAPSULE | Freq: Two times a day (BID) | ORAL | 3 refills | Status: DC

## 2022-09-26 NOTE — Progress Notes (Unsigned)
09/26/2022 12:20 PM   Joseph Gentry [birth year] [id number]  Referring provider: Benetta Spar, MD [address] [place],  [place] [address]  No chief complaint on file.   HPI:  No recent PSA. IPSS 5 QOL 1 on flomax BID. Urine stream strong. No straining to urinate.   PMH: Past Medical History:  Diagnosis Date   Alcoholism ([facility])    Depression    GERD (gastroesophageal reflux disease)    Prostate cancer [facility])     Surgical History: Past Surgical History:  Procedure Laterality Date   NOSE SURGERY      Home Medications:  Allergies as of 09/26/2022   No Known Allergies      Medication List        Accurate as of September 26, 2022 12:20 PM. If you have any questions, ask your nurse or doctor.          albuterol (2.5 MG/3ML) 0.083% nebulizer solution Commonly known as: PROVENTIL Take 3 mLs (2.5 mg total) by nebulization every 6 (six) hours as needed for wheezing or shortness of breath.   cetirizine 10 MG tablet Commonly known as: ZYRTEC Take 1 tablet (10 mg total) by mouth daily. What changed:  when to take this reasons to take this   fluticasone 50 MCG/ACT nasal spray Commonly known as: FLONASE Place 2 sprays into both nostrils daily. What changed:  when to take this reasons to take this   metoprolol tartrate 25 MG tablet Commonly known as: LOPRESSOR Take 25 mg by mouth 2 (two) times daily.   mirtazapine 15 MG tablet Commonly known as: REMERON Take 15 mg by mouth at bedtime.   omeprazole 40 MG capsule Commonly known as: PRILOSEC Take 40 mg by mouth daily.   Stiolto Respimat 2.5-2.5 MCG/ACT Aers Generic drug: Tiotropium Bromide-Olodaterol Inhale 2 puffs into the lungs daily.   tamsulosin 0.4 MG Caps capsule Commonly known as: FLOMAX Take 1 capsule (0.4 mg total) by mouth 2 (two) times daily.        Allergies: No Known Allergies  Family History: Family History  Problem Relation Age of Onset   Prostate cancer Neg Hx     Colon cancer Neg Hx    Liver disease Neg Hx     Social History:  reports that he has been smoking cigarettes. He started smoking about 58 years ago. He has a 28.50 pack-year smoking history. He has never used smokeless tobacco. He reports current alcohol use of about 9.0 standard drinks of alcohol per week. He reports that he does not currently use drugs after having used the following drugs: Cocaine and Marijuana.  ROS: All other review of systems were reviewed and are negative except what is noted above in HPI  Physical Exam: BP 127/86   Pulse (!) 120   Constitutional:  Alert and oriented, No acute distress. HEENT: [place] AT, moist mucus membranes.  Trachea midline, no masses. Cardiovascular: No clubbing, cyanosis, or edema. Respiratory: Normal respiratory effort, no increased work of breathing. GI: Abdomen is soft, nontender, nondistended, no abdominal masses GU: No CVA tenderness.  Lymph: No cervical or inguinal lymphadenopathy. Skin: No rashes, bruises or suspicious lesions. Neurologic: Grossly intact, no focal deficits, moving all 4 extremities. Psychiatric: Normal mood and affect.  Laboratory Data: Lab Results  Component Value Date   WBC 6.4 12/18/2015   HGB 17.6 (H) 02/03/2022   HCT 51.1 (H) 02/03/2022   MCV 100.4 (H) 02/03/2022   PLT 229 12/18/2015    Lab Results  Component Value Date   CREATININE 1.00 12/18/2015    Lab Results  Component Value Date   PSA <0.1 08/19/2019    Lab Results  Component Value Date   TESTOSTERONE 54 (L) 08/19/2019    No results found for: "HGBA1C"  Urinalysis No results found for: "COLORURINE", "APPEARANCEUR", "LABSPEC", "PHURINE", "GLUCOSEU", "HGBUR", "BILIRUBINUR", "KETONESUR", "PROTEINUR", "UROBILINOGEN", "NITRITE", "LEUKOCYTESUR"  No results found for: "LABMICR", "WBCUA", "RBCUA", "LABEPIT", "MUCUS", "BACTERIA"  Pertinent Imaging: *** No results found for this or any previous visit.  No results found for this or any  previous visit.  No results found for this or any previous visit.  No results found for this or any previous visit.  Results for orders placed in visit on 04/26/21  Ultrasound renal complete  Narrative CLINICAL DATA:  Left flank pain.  EXAM: RENAL / URINARY TRACT ULTRASOUND COMPLETE  COMPARISON:  None.  FINDINGS: Right Kidney:  Renal measurements: 9.3 x 4.0 x 4.9 cm = volume: 94 mL. Echogenicity within normal limits. No mass or hydronephrosis visualized.  Left Kidney:  Renal measurements: 9.8 x 5.9 x 4.6 cm = volume: 140 mL. Echogenicity within normal limits. No mass or hydronephrosis visualized.  Bladder:  Appears normal for degree of bladder distention.  Other:  There is diffuse increased liver echogenicity most commonly seen in the setting of fatty infiltration. Superimposed inflammation or fibrosis is not excluded. Clinical correlation is recommended.  IMPRESSION: 1. Unremarkable kidneys and urinary bladder. 2. Fatty liver.   Electronically Signed By: Elgie Collard M.D. On: 05/09/2021 01:54  No valid procedures specified. No results found for this or any previous visit.  No results found for this or any previous visit.   Assessment & Plan:    1. Prostate cancer *** - PSA - PSA; Future  2. Benign prostatic hyperplasia with urinary obstruction ***  3. Weak urinary stream ***   No follow-ups on file.  Wilkie Aye, MD  [facility] [place]

## 2022-09-27 LAB — PSA: Prostate Specific Ag, Serum: 0.2 ng/mL (ref 0.0–4.0)

## 2022-09-29 DIAGNOSIS — J439 Emphysema, unspecified: Secondary | ICD-10-CM | POA: Diagnosis not present

## 2022-09-29 DIAGNOSIS — K219 Gastro-esophageal reflux disease without esophagitis: Secondary | ICD-10-CM | POA: Diagnosis not present

## 2022-09-30 ENCOUNTER — Encounter: Payer: Self-pay | Admitting: Urology

## 2022-09-30 NOTE — Patient Instructions (Signed)

## 2022-10-29 DIAGNOSIS — J439 Emphysema, unspecified: Secondary | ICD-10-CM | POA: Diagnosis not present

## 2022-10-29 DIAGNOSIS — K219 Gastro-esophageal reflux disease without esophagitis: Secondary | ICD-10-CM | POA: Diagnosis not present

## 2022-11-24 ENCOUNTER — Ambulatory Visit (HOSPITAL_COMMUNITY): Admission: RE | Admit: 2022-11-24 | Payer: 59 | Source: Ambulatory Visit

## 2022-11-29 DIAGNOSIS — K219 Gastro-esophageal reflux disease without esophagitis: Secondary | ICD-10-CM | POA: Diagnosis not present

## 2022-11-29 DIAGNOSIS — J439 Emphysema, unspecified: Secondary | ICD-10-CM | POA: Diagnosis not present

## 2022-12-01 ENCOUNTER — Emergency Department (HOSPITAL_COMMUNITY)
Admission: EM | Admit: 2022-12-01 | Discharge: 2022-12-01 | Disposition: A | Discharge status: Home / Self Care | Payer: 59 | Attending: Emergency Medicine | Admitting: Emergency Medicine

## 2022-12-01 ENCOUNTER — Emergency Department (HOSPITAL_COMMUNITY): Payer: 59

## 2022-12-01 ENCOUNTER — Encounter (HOSPITAL_COMMUNITY): Payer: Self-pay

## 2022-12-01 ENCOUNTER — Other Ambulatory Visit: Payer: Self-pay

## 2022-12-01 DIAGNOSIS — M5136 Other intervertebral disc degeneration, lumbar region: Secondary | ICD-10-CM | POA: Diagnosis not present

## 2022-12-01 DIAGNOSIS — Z8546 Personal history of malignant neoplasm of prostate: Secondary | ICD-10-CM | POA: Insufficient documentation

## 2022-12-01 DIAGNOSIS — M5441 Lumbago with sciatica, right side: Secondary | ICD-10-CM | POA: Insufficient documentation

## 2022-12-01 DIAGNOSIS — W1830XA Fall on same level, unspecified, initial encounter: Secondary | ICD-10-CM | POA: Diagnosis not present

## 2022-12-01 DIAGNOSIS — M545 Low back pain, unspecified: Secondary | ICD-10-CM | POA: Diagnosis not present

## 2022-12-01 MED ORDER — DEXAMETHASONE SODIUM PHOSPHATE 10 MG/ML IJ SOLN
10.0000 mg | Freq: Once | INTRAMUSCULAR | Status: AC
  Administered 2022-12-01: 10 mg via INTRAMUSCULAR
  Filled 2022-12-01: qty 1

## 2022-12-01 MED ORDER — KETOROLAC TROMETHAMINE 15 MG/ML IJ SOLN
15.0000 mg | Freq: Once | INTRAMUSCULAR | Status: AC
  Administered 2022-12-01: 15 mg via INTRAMUSCULAR
  Filled 2022-12-01: qty 1

## 2022-12-01 MED ORDER — METHYLPREDNISOLONE 4 MG PO TBPK: ORAL_TABLET | ORAL | 0 refills | Status: DC

## 2022-12-01 NOTE — ED Triage Notes (Signed)
Reports fell awhile ago and started having back pain on the right side but its now worse and goes down right leg causing his leg to go numb.  Also had a fall last night.

## 2022-12-01 NOTE — ED Provider Notes (Signed)
[place] EMERGENCY DEPARTMENT AT [facility] Provider Note   CSN: [id number] Arrival date & time: 12/01/22  7829     History  Chief Complaint  Patient presents with   Leg Pain    Joseph Gentry is a 72 y.o. male.  Patient complains of right-sided low back and leg pain which began weeks ago but have been worsening over the past few days.  Patient states that last night he fell, landing directly on the buttocks.  The patient is here with family who came to have him evaluated.  Patient complains of pain which is been gradually worsening.  He denies midline spinal tenderness.  He states that the pain shoots down the exterior lateral right leg down to just above the right foot.  He denies associated weakness.  Past medical history significant for prostate cancer, alcoholism, GERD  HPI     Home Medications Prior to Admission medications   Medication Sig Start Date End Date Taking? Authorizing Provider  methylPREDNISolone (MEDROL DOSEPAK) 4 MG TBPK tablet Take as directed per package instructions 12/01/22  Yes Barrie Dunker B, PA-C  albuterol (PROVENTIL) (2.5 MG/3ML) 0.083% nebulizer solution Take 3 mLs (2.5 mg total) by nebulization every 6 (six) hours as needed for wheezing or shortness of breath. 08/19/21   Charlott Holler, MD  cetirizine (ZYRTEC) 10 MG tablet Take 1 tablet (10 mg total) by mouth daily. Patient taking differently: Take 10 mg by mouth as needed. 10/26/19   Steffanie Dunn, DO  fluticasone (FLONASE) 50 MCG/ACT nasal spray Place 2 sprays into both nostrils daily. Patient taking differently: Place 2 sprays into both nostrils as needed. 10/26/19   Steffanie Dunn, DO  metoprolol tartrate (LOPRESSOR) 25 MG tablet Take 25 mg by mouth 2 (two) times daily. 09/01/22   [provider]  mirtazapine (REMERON) 15 MG tablet Take 15 mg by mouth at bedtime. 07/28/19   [provider]  omeprazole (PRILOSEC) 40 MG capsule Take 40 mg by mouth daily.    [provider]  tamsulosin (FLOMAX) 0.4 MG CAPS capsule Take 1 capsule (0.4 mg total) by mouth 2 (two) times daily. 09/26/22   McKenzie, Mardene Celeste, MD  Tiotropium Bromide-Olodaterol (STIOLTO RESPIMAT) 2.5-2.5 MCG/ACT AERS Inhale 2 puffs into the lungs daily. 08/19/21   Charlott Holler, MD      Allergies    Patient has no known allergies.    Review of Systems   Review of Systems  Physical Exam Updated Vital Signs BP 131/79 (BP Location: Left Arm)   Pulse (!) 59   Temp 97.8 F (36.6 C) (Oral)   Resp 16   Ht 5\' 8"  (1.727 m)   Wt 62.6 kg   SpO2 93%   BMI 20.98 kg/m  Physical Exam Vitals and nursing note reviewed.  HENT:     Head: Normocephalic and atraumatic.  Eyes:     Pupils: Pupils are equal, round, and reactive to light.  Pulmonary:     Effort: Pulmonary effort is normal. No respiratory distress.  Musculoskeletal:        General: Tenderness (Right-sided lumbar tenderness.  No midline tenderness appreciated in the cervical, thoracic, or lumbar region) present. No signs of injury. Normal range of motion.     Cervical back: Normal range of motion and neck supple. No tenderness.  Skin:    General: Skin is dry.     Capillary Refill: Capillary refill takes less than 2 seconds.  Neurological:     General:  No focal deficit present.     Mental Status: He is alert.     Sensory: No sensory deficit.     Motor: No weakness.     Gait: Gait normal.  Psychiatric:        Speech: Speech normal.        Behavior: Behavior normal.     ED Results / Procedures / Treatments   Labs (all labs ordered are listed, but only abnormal results are displayed) Labs Reviewed - No data to display  EKG None  Radiology DG Lumbar Spine Complete  Result Date: 12/01/2022 CLINICAL DATA:  pain EXAM: LUMBAR SPINE - COMPLETE 4 VIEW COMPARISON:  CTAP 08/23/18 FINDINGS: Approximate 6 mm right-sided renal stone. There are likely 6 lumbar type vertebral bodies with lumbarization of S1. There is no evidence of  an acute lumbar spine fracture. Unchanged superior endplate height loss at L3 alignment is normal. Mild disc space loss in the lower lumbar spine at L5-S1 S1-S2. IMPRESSION: 1. No acute fracture or traumatic listhesis. 2. Mild degenerative disc disease in the lower lumbar spine. 3. Approximate 6 mm right-sided renal stone. Electronically Signed   By: Lorenza Cambridge M.D.   On: 12/01/2022 10:15    Procedures Procedures    Medications Ordered in ED Medications  ketorolac (TORADOL) 15 MG/ML injection 15 mg (has no administration in time range)  dexamethasone (DECADRON) injection 10 mg (10 mg Intramuscular Given 12/01/22 1012)    ED Course/ Medical Decision Making/ A&P                             Medical Decision Making Amount and/or Complexity of Data Reviewed Radiology: ordered.  Risk Prescription drug management.   This patient presents to the ED for concern of low back pain with radiation into the right lower leg, this involves an extensive number of treatment options, and is a complaint that carries with it a high risk of complications and morbidity.  The differential diagnosis includes fracture, dislocation, lumbar pain with sciatica, others   Co morbidities that complicate the patient evaluation  Prostate cancer, GERD, alcoholism   Additional history obtained:  Additional history obtained from family at bedside   Imaging Studies ordered:  I ordered imaging studies including plain films of the lumbar spine I independently visualized and interpreted imaging which showed  1. No acute fracture or traumatic listhesis.  2. Mild degenerative disc disease in the lower lumbar spine.  3. Approximate 6 mm right-sided renal stone.   I agree with the radiologist interpretation   Problem List / ED Course / Critical interventions / Medication management   I ordered medication including Toradol and Decadron for low back pain Reevaluation of the patient after these medicines showed  that the patient improved I have reviewed the patients home medicines and have made adjustments as needed   Test / Admission - Considered:  Onset of pain was nontraumatic in nature.  Lumbar films showed mild degenerative disc disease but no acute fracture or traumatic listhesis.  There was an incidental 6 mm right-sided stone noted.  There is no indication at this time for CT scans of the back.  Patient's presentation is consistent with low back pain with right-sided radiculopathy.  Plan to discharge home on steroid Dosepak with recommendations for follow-up with primary care for further evaluation and management as needed including possible physical therapy         Final Clinical Impression(s) / ED Diagnoses  Final diagnoses:  Right-sided low back pain with right-sided sciatica, unspecified chronicity    Rx / DC Orders ED Discharge Orders          Ordered    methylPREDNISolone (MEDROL DOSEPAK) 4 MG TBPK tablet        12/01/22 1045              Pamala Duffel 12/01/22 1100    Ernie Avena, MD 12/01/22 2023

## 2022-12-01 NOTE — Discharge Instructions (Addendum)
You were evaluated today for back pain. Your x-rays were reassuring. Your symptoms are consistent with sciatica.  Please take the prescribed steroids as directed.  You may take over-the-counter anti-inflammatory medications if needed but please refrain from taking until tomorrow at noon as the medication you had today should last that long.  Follow-up with your primary care provider for further evaluation and management as needed.  If you develop any life-threatening symptoms please return to the emergency department.

## 2022-12-08 ENCOUNTER — Telehealth: Payer: Self-pay

## 2022-12-08 NOTE — Telephone Encounter (Signed)
Transition Care Management Unsuccessful Follow-up Telephone Call  Date of discharge and from where:  [place] 6/17  Attempts:  1st Attempt  Reason for unsuccessful TCM follow-up call:  Left voice message   Kaysa Roulhac [facility] Care Guide, [place] [phone] [address], [place], [place] [address] Phone: [phone] Email: [email or web address]       

## 2022-12-09 ENCOUNTER — Telehealth: Payer: Self-pay

## 2022-12-09 NOTE — Telephone Encounter (Signed)
Transition Care Management Unsuccessful Follow-up Telephone Call  Date of discharge and from where:  [place] 6/17  Attempts:  2nd Attempt  Reason for unsuccessful TCM follow-up call:  Left voice message   Idania Desouza [facility] Care Guide, [place] [phone] [address], [place], [place] [address] Phone: [phone] Email: [email or web address]       

## 2022-12-29 DIAGNOSIS — K219 Gastro-esophageal reflux disease without esophagitis: Secondary | ICD-10-CM | POA: Diagnosis not present

## 2022-12-29 DIAGNOSIS — J439 Emphysema, unspecified: Secondary | ICD-10-CM | POA: Diagnosis not present

## 2023-01-28 ENCOUNTER — Other Ambulatory Visit: Payer: Self-pay | Admitting: Urology

## 2023-02-13 DIAGNOSIS — J439 Emphysema, unspecified: Secondary | ICD-10-CM | POA: Diagnosis not present

## 2023-02-13 DIAGNOSIS — K219 Gastro-esophageal reflux disease without esophagitis: Secondary | ICD-10-CM | POA: Diagnosis not present

## 2023-03-16 DIAGNOSIS — K219 Gastro-esophageal reflux disease without esophagitis: Secondary | ICD-10-CM | POA: Diagnosis not present

## 2023-03-16 DIAGNOSIS — J439 Emphysema, unspecified: Secondary | ICD-10-CM | POA: Diagnosis not present

## 2023-04-17 DIAGNOSIS — R000 Tachycardia, unspecified: Secondary | ICD-10-CM | POA: Diagnosis not present

## 2023-04-17 DIAGNOSIS — K219 Gastro-esophageal reflux disease without esophagitis: Secondary | ICD-10-CM | POA: Diagnosis not present

## 2023-04-17 DIAGNOSIS — J439 Emphysema, unspecified: Secondary | ICD-10-CM | POA: Diagnosis not present

## 2023-04-17 DIAGNOSIS — Z23 Encounter for immunization: Secondary | ICD-10-CM | POA: Diagnosis not present

## 2023-05-17 DIAGNOSIS — J439 Emphysema, unspecified: Secondary | ICD-10-CM | POA: Diagnosis not present

## 2023-05-17 DIAGNOSIS — K219 Gastro-esophageal reflux disease without esophagitis: Secondary | ICD-10-CM | POA: Diagnosis not present

## 2023-06-12 DIAGNOSIS — F17210 Nicotine dependence, cigarettes, uncomplicated: Secondary | ICD-10-CM | POA: Diagnosis not present

## 2023-06-12 DIAGNOSIS — R0902 Hypoxemia: Secondary | ICD-10-CM | POA: Diagnosis not present

## 2023-06-12 DIAGNOSIS — R918 Other nonspecific abnormal finding of lung field: Secondary | ICD-10-CM | POA: Diagnosis not present

## 2023-06-12 DIAGNOSIS — J441 Chronic obstructive pulmonary disease with (acute) exacerbation: Secondary | ICD-10-CM | POA: Diagnosis not present

## 2023-06-22 ENCOUNTER — Encounter: Payer: Self-pay | Admitting: *Deleted

## 2023-06-22 ENCOUNTER — Telehealth: Payer: Self-pay | Admitting: *Deleted

## 2023-06-22 NOTE — Patient Outreach (Signed)
  Care Coordination   Initial Visit Note   06/22/2023 Name: Joseph Gentry MRN: [id number] DOB: [birth year]  Joseph Gentry is a 73 y.o. year old male who sees Fanta, Benita Area, MD for primary care. I  spoke with patient's daughter Joseph Gentry by telephone today to follow-up on patient's recent ED visit for COPD exacerbation and to offer information on Care Management Services.   Joseph Gentry daughter, Joseph Gentry, was given information about Complex Care Management services today including:   The Complex Care Management services include support from the care team which includes your Nurse Coordinator, Clinical Social Worker, or Pharmacist.  The Complex Care Management team is here to help remove barriers to the health concerns and goals most important to you. Complex Care Management services are voluntary, and the patient may decline or stop services at any time by request to their care team member.   What matters to the patients health and wellness today?  Rescheduling Gentry appointment    Goals Addressed             This Visit's Progress    Follow-up with Gentry       Care Management Goals: Patient/daughter will talk with [place] Gentry to reschedule appointment from tomorrow, 06/23/23, to a day later this week Patient/daughter will reach out to RN Care Manager at [phone] with any resource or care management needs        SDOH assessments and interventions completed:  Yes  SDOH Interventions Today    Flowsheet Row Most Recent Value  SDOH Interventions   Housing Interventions Intervention Not Indicated  Transportation Interventions Patient Resources (Friends/Family)        Care Coordination Interventions:  Yes, provided  Interventions Today    Flowsheet Row Most Recent Value  Chronic Disease   Chronic disease during today's visit Chronic Obstructive Gentry Disease (COPD)  General Interventions   General Interventions Discussed/Reviewed General Interventions  Discussed, General Interventions Reviewed, Doctor Visits, Durable Medical Equipment (DME), Communication with  Doctor Visits Discussed/Reviewed Doctor Visits Discussed, Doctor Visits Reviewed, Specialist  [reviewed recent ED visit for COPD exacerbation]  Durable Medical Equipment (DME) Other  [nebulizer]  PCP/Specialist Visits Compliance with follow-up visit  [scheduled with Joseph Gentry for 06/23/23, but needs to reschedule due to inclement weather]  Communication with PCP/Specialists  [staff message sent to [facility] asking them to reach out to patient's daughter to reschedule acute appointment. Daughter also plans to call their office tomorrow morning.]  Education Interventions   Education Provided Provided Education  Provided Verbal Education On When to see the doctor, Medication, Other  [[place] [facility] Care Management Program]  Pharmacy Interventions   Pharmacy Dicussed/Reviewed Pharmacy Topics Discussed, Pharmacy Topics Reviewed, Medications and their functions  [Taking medications as instructed]      Complex Care Management Consent Status: Patient agreed to services and verbal consent obtained.   Follow up plan: Follow up call scheduled for 06/29/23    Encounter Outcome:  Patient Visit Completed   [name], RN, BSN Care Manager [place]  [facility]  Population Health  Direct Dial: [phone] Main #: [phone]

## 2023-06-23 ENCOUNTER — Ambulatory Visit: Payer: 59 | Admitting: Adult Health

## 2023-06-29 ENCOUNTER — Encounter: Payer: Self-pay | Admitting: *Deleted

## 2023-06-29 ENCOUNTER — Other Ambulatory Visit: Payer: Self-pay | Admitting: Urology

## 2023-06-29 ENCOUNTER — Ambulatory Visit: Payer: Self-pay | Admitting: *Deleted

## 2023-06-29 NOTE — Patient Outreach (Signed)
  Care Coordination   Follow Up Visit Note   06/29/2023 Name: Joseph Gentry MRN: [id number] DOB: 1951/05/26  Joseph Gentry is a 73 y.o. year old male who sees Fanta, Benita Area, MD for primary care. I  spoke with patient's daughter, Joseph Gentry, by telephone today.   What matters to the patients health and wellness today?  Managing COPD    Goals Addressed   None     SDOH assessments and interventions completed:  Yes  SDOH Interventions Today    Flowsheet Row Most Recent Value  SDOH Interventions   Transportation Interventions Patient Resources (Friends/Family)        Care Coordination Interventions:  Yes, provided  Interventions Today    Flowsheet Row Most Recent Value  Chronic Disease   Chronic disease during today's visit Chronic Obstructive Pulmonary Disease (COPD)  General Interventions   General Interventions Discussed/Reviewed General Interventions Discussed, General Interventions Reviewed, Doctor Visits, Durable Medical Equipment (DME)  Doctor Visits Discussed/Reviewed Doctor Visits Discussed, Doctor Visits Reviewed, Specialist, PCP  Durable Medical Equipment (DME) Other  [nebulizer]  PCP/Specialist Visits Compliance with follow-up visit  [Pulmonary 07/21/23, PCP Appt. today]  Exercise Interventions   Exercise Discussed/Reviewed Physical Activity  Physical Activity Discussed/Reviewed Physical Activity Reviewed, Physical Activity Discussed  Education Interventions   Education Provided Provided Education  Provided Verbal Education On When to see the doctor, Medication  Nutrition Interventions   Nutrition Discussed/Reviewed Nutrition Discussed, Nutrition Reviewed, Fluid intake  Pharmacy Interventions   Pharmacy Dicussed/Reviewed Pharmacy Topics Discussed, Pharmacy Topics Reviewed, Medications and their functions  Safety Interventions   Safety Discussed/Reviewed Safety Discussed, Safety Reviewed, Fall Risk, Home Safety  Home Safety Assistive Devices       Follow  up plan: Follow up call scheduled for 07/22/23    Encounter Outcome:  Patient Visit Completed   Joseph Pellet, RN, BSN [facility]  [facility], [facility] RN Care Manager Direct Dial: [phone]

## 2023-07-14 ENCOUNTER — Encounter: Payer: Self-pay | Admitting: Acute Care

## 2023-07-21 ENCOUNTER — Ambulatory Visit (INDEPENDENT_AMBULATORY_CARE_PROVIDER_SITE_OTHER): Payer: 59 | Admitting: Pulmonary Disease

## 2023-07-21 ENCOUNTER — Ambulatory Visit: Payer: 59

## 2023-07-21 ENCOUNTER — Telehealth: Payer: Self-pay | Admitting: Pulmonary Disease

## 2023-07-21 ENCOUNTER — Encounter: Payer: Self-pay | Admitting: Pulmonary Disease

## 2023-07-21 VITALS — BP 116/72 | HR 91 | Temp 97.9°F | Ht 68.0 in | Wt 153.4 lb

## 2023-07-21 DIAGNOSIS — J441 Chronic obstructive pulmonary disease with (acute) exacerbation: Secondary | ICD-10-CM

## 2023-07-21 DIAGNOSIS — J439 Emphysema, unspecified: Secondary | ICD-10-CM

## 2023-07-21 MED ORDER — PREDNISONE 10 MG PO TABS: 60.0000 mg | ORAL_TABLET | Freq: Every day | ORAL | 0 refills | Status: DC

## 2023-07-21 MED ORDER — TRELEGY ELLIPTA 200-62.5-25 MCG/ACT IN AEPB: 1.0000 | INHALATION_SPRAY | Freq: Every day | RESPIRATORY_TRACT | 3 refills | Status: AC

## 2023-07-21 NOTE — Telephone Encounter (Signed)
Called pharmacy and clarified prednisone ordered.  Nothing further needed.

## 2023-07-21 NOTE — Telephone Encounter (Signed)
PT was told to FU in one mo w/Dr. Isaiah Serge or Celine Mans. No open appts. Can I sched w/NP? PT did not check out.

## 2023-07-21 NOTE — Patient Instructions (Signed)
 VISIT SUMMARY:  Today, we discussed your worsening shortness of breath and other symptoms related to your COPD. We reviewed your current treatment plan and made some adjustments to help improve your condition. We also talked about your successful smoking cessation and the importance of continuing this progress. Additionally, we addressed the need to reschedule your missed annual CT scan.  YOUR PLAN:  -CHRONIC OBSTRUCTIVE PULMONARY DISEASE (COPD) EXACERBATION: COPD exacerbation means a worsening of your chronic lung condition, leading to increased shortness of breath, wheezing, and lower oxygen levels. We will continue your Trelegy medication once daily, increase the dose, and add prednisone  60 mg with a tapering schedule. You should continue using the nebulizer twice daily, increasing to four times daily if needed, and maintain oxygen therapy at level 2, especially during sleep and as needed to keep your oxygen saturation above 88%. A chest x-ray has been ordered to rule out any new issues.  -SMOKING CESSATION: Quitting smoking is crucial for managing COPD and improving lung health. You have successfully quit smoking for the past three to four weeks. Please continue to avoid smoking, and we will provide support and resources as needed.  -GENERAL HEALTH MAINTENANCE: Regular health screenings are important for monitoring chronic conditions. You missed your annual CT scan in 2024 due to illness, so it is important to reschedule this scan to keep track of your lung health.  INSTRUCTIONS:  Please follow up with your primary care physician and pulmonologist as needed. Ensure that your prescriptions for Trelegy and prednisone  are filled at [facility]. Additionally, reschedule your missed annual CT scan.

## 2023-07-21 NOTE — Progress Notes (Signed)
 Joseph Gentry    [id number]    1950-09-03  Primary Care Physician:Fanta, Joseph Area, MD  Referring Physician: Carlette Joseph Area, MD [address] [place],  [place] [address]  Chief complaint: Acute visit for COPD exacerbation  HPI: 73 y.o. who  has a past medical history of Alcoholism (HCC), Depression, GERD (gastroesophageal reflux disease), and Prostate cancer (HCC).   Discussed the use of AI scribe software for clinical note transcription with the patient, who gave verbal consent to proceed.  Joseph Gentry is a 73 year old male with COPD who presents with worsening shortness of breath. He is accompanied by his daughter, Joseph Gentry.  He has been experiencing worsening shortness of breath over the past several months, particularly when walking. His symptoms have worsened since November, with increased wheezing and difficulty walking. During the Christmas holiday, his family noticed significant wheezing and chest retractions, prompting a visit to the emergency room where his oxygen saturation was found to be 80-81%.  Chest x-ray at that time at [facility] showed some basal atelectasis.  All ED notes and imaging reviewed in detail  He has been using an oxygen concentrator at home, initially unable to maintain oxygen saturation above 83%. His primary care physician adjusted his medication to Trelegy once daily and recommended using the nebulizer four times a day, along with continuous oxygen therapy at level two. This regimen has improved his oxygen saturation to 89-91%, though it drops to 87-88% when the oxygen is removed.  He reports a persistent cough with sputum production, initially brownish and clumpy, now lighter yellow. No fever or chills. He started Trelegy around January 10th and has been using it alongside the nebulizer. He experiences fatigue and mild memory issues, particularly when exerting himself. He has been using the nebulizer four times a day, which has  helped reduce wheezing.  He quit smoking three to four weeks ago and has not required nicotine patches. He has been living with his daughter, Joseph Gentry, who does not allow smoking in her home.   Outpatient Encounter Medications as of 07/21/2023  Medication Sig   albuterol  (PROVENTIL ) (2.5 MG/3ML) 0.083% nebulizer solution Take 3 mLs (2.5 mg total) by nebulization every 6 (six) hours as needed for wheezing or shortness of breath.   cetirizine  (ZYRTEC ) 10 MG tablet Take 1 tablet (10 mg total) by mouth daily. (Patient taking differently: Take 10 mg by mouth as needed.)   fluticasone  (FLONASE ) 50 MCG/ACT nasal spray Place 2 sprays into both nostrils daily. (Patient taking differently: Place 2 sprays into both nostrils as needed.)   Fluticasone -Umeclidin-Vilant (TRELEGY ELLIPTA ) 200-62.5-25 MCG/ACT AEPB Inhale 1 puff into the lungs daily.   metoprolol tartrate (LOPRESSOR) 25 MG tablet Take 25 mg by mouth 2 (two) times daily.   mirtazapine (REMERON) 15 MG tablet Take 15 mg by mouth at bedtime.   Nebulizers (PARI LC PLUS NEBULIZER) MISC See admin instructions.   nicotine (NICODERM CQ - DOSED IN MG/24 HR) 7 mg/24hr patch 7 mg daily.   omeprazole (PRILOSEC) 40 MG capsule Take 40 mg by mouth daily.   pantoprazole  (PROTONIX ) 40 MG tablet Take 40 mg by mouth daily.   predniSONE  (DELTASONE ) 10 MG tablet Take 6 tablets (60 mg total) by mouth daily with breakfast. Take 60 mg/day on day 1 and reduce dose by 10 mg every 2 days until taper is complete   tamsulosin  (FLOMAX ) 0.4 MG CAPS capsule TAKE 1 CAPSULE BY MOUTH TWO TIMES A DAY.   [  DISCONTINUED] TRELEGY ELLIPTA  100-62.5-25 MCG/ACT AEPB Inhale 1 puff into the lungs daily.   [DISCONTINUED] methylPREDNISolone  (MEDROL  DOSEPAK) 4 MG TBPK tablet Take as directed per package instructions (Patient not taking: Reported on 07/21/2023)   [DISCONTINUED] Tiotropium Bromide-Olodaterol (STIOLTO RESPIMAT ) 2.5-2.5 MCG/ACT AERS Inhale 2 puffs into the lungs daily. (Patient not taking:  Reported on 07/21/2023)   No facility-administered encounter medications on file as of 07/21/2023.   Physical Exam: Blood pressure 116/72, pulse 91, temperature 97.9 F (36.6 C), temperature source Oral, height 5' 8 (1.727 m), weight 153 lb 6.4 oz (69.6 kg), SpO2 90%. Gen:      No acute distress HEENT:  EOMI, sclera anicteric Neck:     No masses; no thyromegaly Lungs:    Bilateral expiratory wheeze CV:         Regular rate and rhythm; no murmurs Abd:      + bowel sounds; soft, non-tender; no palpable masses, no distension Ext:    No edema; adequate peripheral perfusion Skin:      Warm and dry; no rash Neuro: alert and oriented x 3 Psych: normal mood and affect  Data Reviewed: Imaging: CT chest 11/20/2021 Moderate emphysema, subsegmental atelectasis  Chest x-ray 06/12/2023 [[facility]] Patchy opacities at the lung bases likely atelectasis, superimposed infection not excluded.  No large pleural effusion or pneumothorax.  Mild interstitial opacities may be chronic.  Normal cardiomediastinal silhouette.    PFTs: 10/24/2019 FVC 2.69 [104%], FEV1 2.11 [79%], F/F57, TLC 6.36 [96%], DLCO 11.16 [46%] Mild obstruction, moderate diffusion defect  Labs:  Assessment and Plan Chronic Obstructive Pulmonary Disease (COPD) Exacerbation Worsening shortness of breath, wheezing, and decreased oxygen saturation levels indicate a COPD exacerbation. Symptoms have declined since November. Previously on Stiolto Respimat , switched to Trelegy and nebulizer treatments. Despite these, continues to experience wheezing and low oxygen levels. Requires higher dose of Trelegy and prednisone  for management. Oxygen therapy is essential to maintain saturation above 88%.  - Continue Trelegy once daily - Increase Trelegy dose - Prescribe prednisone  60 mg with tapering by 10 mg every two days - Continue nebulizer treatments twice daily, increase to four times daily if needed - Maintain oxygen therapy at 2 L/min, use  during sleep and as needed to keep oxygen saturation above 88% - Order chest x-ray to rule out new pathology  Smoking Cessation Successfully quit smoking for the past three to four weeks, significantly benefiting COPD management and lung health. - Encourage continued smoking cessation - Provide support and resources as needed  General Health Maintenance Missed annual CT scan in 2024 due to illness. Regular screening is essential for monitoring chronic conditions. - Reschedule annual CT scan  Follow-up - Follow up with primary care physician and pulmonologist as needed - Ensure prescriptions for Trelegy and prednisone  are filled at [facility].   Recommendations: Increase Trelegy to 200 Prednisone  taper starting at 60 mg Continue supplemental oxygen Chest x-ray Resume low-dose screening CT  Lonna Coder MD [facility] 07/21/2023, 1:49 PM  CC: Carlette [place]*

## 2023-07-21 NOTE — Telephone Encounter (Signed)
[facility] needs clarification on medication prednisone taper. Please call and advise [phone]

## 2023-07-22 ENCOUNTER — Telehealth: Payer: Self-pay | Admitting: Acute Care

## 2023-07-22 ENCOUNTER — Ambulatory Visit: Payer: Self-pay | Admitting: *Deleted

## 2023-07-22 NOTE — Telephone Encounter (Signed)
 Left VM to call for scheduling annual LDCT.

## 2023-07-22 NOTE — Patient Outreach (Signed)
  Care Coordination   07/22/2023 Name: Joseph Gentry MRN: [id number] DOB: 01/07/1951   Care Coordination Outreach Attempts:  An unsuccessful outreach was attempted for an appointment today.  Follow Up Plan:  Additional outreach attempts will be made to offer the patient complex care management information and services.   Encounter Outcome:  No Answer.Left HIPAA compliant VM.   Care Coordination Interventions:  No, not indicated. Staff message sent to care guide requesting outreach and rescheduling.    [name], RN, BSN [place]  [facility], [facility] RN Care Manager Direct Dial: [phone]

## 2023-07-23 NOTE — Telephone Encounter (Signed)
 I think ok to schedule with NP for the 1 month follow up and he can see one of us  subsequently.

## 2023-07-23 NOTE — Telephone Encounter (Signed)
 Appt made. NFN

## 2023-07-23 NOTE — Telephone Encounter (Signed)
 Lm x1 for patient.   Dr. Waylan Haggard and Dr. Dione Franks, patient was instructed to follow up in 27mo but there is no availability. Please advise. Thanks

## 2023-07-23 NOTE — Telephone Encounter (Signed)
 Please schedule with NP when patient calls back. Thanks

## 2023-07-28 ENCOUNTER — Other Ambulatory Visit: Payer: Self-pay | Admitting: Urology

## 2023-07-29 ENCOUNTER — Other Ambulatory Visit: Payer: Self-pay

## 2023-07-29 MED ORDER — TAMSULOSIN HCL 0.4 MG PO CAPS: 0.4000 mg | ORAL_CAPSULE | Freq: Two times a day (BID) | ORAL | 1 refills | Status: DC

## 2023-08-12 ENCOUNTER — Telehealth: Payer: Self-pay | Admitting: *Deleted

## 2023-08-12 NOTE — Progress Notes (Unsigned)
 Complex Care Management Care Guide Note  08/12/2023 Name: [name] MRN: [id number] DOB: Jun 25, 1950  Joseph Gentry is a 73 y.o. year old male who is a primary care patient of Felecia Shelling, Wayland Salinas, MD and is actively engaged with the care management team. I reached out to Joseph Gentry by phone today to assist with re-scheduling  with the RN Case Manager.  Follow up plan: Unsuccessful telephone outreach attempt made. A HIPAA compliant phone message was left for the patient providing contact information and requesting a return call.  Gwenevere Ghazi  [facility]  [facility], [facility] Guide  Direct Dial: [phone]  Fax [phone]

## 2023-08-14 ENCOUNTER — Other Ambulatory Visit: Payer: 59

## 2023-08-14 NOTE — Progress Notes (Signed)
 Complex Care Management Care Guide Note  08/14/2023 Name: Joseph Gentry MRN: [id number] DOB: [birth year]  Joseph Gentry is a 73 y.o. year old male who is a primary care patient of Felecia Shelling, Wayland Salinas, MD and is actively engaged with the care management team. I reached out to Joseph Gentry by phone today to assist with scheduling  with the RN Case Manager.  Follow up plan: Telephone appointment with complex care management team member scheduled for:  3/3  Gwenevere Ghazi  [facility]  [facility], [facility] Guide  Direct Dial: [phone]  Fax [phone]

## 2023-08-17 ENCOUNTER — Ambulatory Visit: Payer: Self-pay | Admitting: *Deleted

## 2023-08-18 ENCOUNTER — Telehealth: Payer: Self-pay | Admitting: Pulmonary Disease

## 2023-08-18 NOTE — Telephone Encounter (Signed)
 Called and spoke with Joseph Gentry, pt's daughter and dpr. Pt is scheduled for his lung screening CT at [facility] on 09/04/23 at 9:40. They are wanting to coordinate his pulmonary follow up visit that same day since his daughter travels from the [place] area to bring him to his appointments. I asked her if she could bring him to see [name], NP on 09/04/23 at 10:30 after his CT appt. She verified that would work. I explained that I would have to see if we can move the current patient (Mychart visit) that is scheduled at 10:30 to the 1:30 spot. I advised Joseph Gentry that we would call her work to clarify if we were able to get this appt moved so that he could have this appt. She verbalized understanding and stated that we can leave a detailed message for her if she does not answer the phone.  (I have also left a message for the 10:30 pt to call to see if she can move to 1:30 09/04/23).

## 2023-08-18 NOTE — Telephone Encounter (Signed)
 Per Hughes Better @ front desk:   Pt has CT on 3/21 @ 9:40 AM at [facility].  Pt's daughter is coming from [place] and want to sched a follow-up on the same day as the CT. On previous messaged, indicates pt should see PM or ND & then it said ok w/ a NP.  Can you help me to coordinate this?

## 2023-08-19 ENCOUNTER — Ambulatory Visit: Payer: 59 | Admitting: Pulmonary Disease

## 2023-08-19 ENCOUNTER — Ambulatory Visit: Admitting: Internal Medicine

## 2023-08-19 ENCOUNTER — Encounter: Payer: Self-pay | Admitting: *Deleted

## 2023-08-19 NOTE — Telephone Encounter (Signed)
 Patient has been scheduled for Friday 3/21/20025 at 10:30am after his LDCT. Angela Burke, patient's daughter and LVM advising after his CT at 9:40am on 3/21 he will come to our office for f/u appt.

## 2023-08-19 NOTE — Patient Outreach (Signed)
 Care Coordination   Follow Up Visit Note   08/17/2023 Name: Joseph Gentry MRN: [id number] DOB: Jun 21, 1950  Joseph Gentry is a 73 y.o. year old male who sees Joseph, Wayland Salinas, MD for primary care. I  spoke with patient's daughter, Joseph Gentry, by telephone. [name] is currently living with his daughter in [place] but plans to return home with [facility] services. Joseph Gentry is a strong advocate for her father and is very involved in managing his care.    What matters to the patients health and wellness today?  Rescheduling appointment with pulmonologist until after CT scan on 09/04/23.    Goals Addressed             This Visit's Progress    Follow-up with Pulmonary       Care Management Goals: Patient/daughter will talk with [facility] regarding rescheduling pulmonology appointment originally scheduled for 08/19/23 until after CT scan Staff message sent to [facility] Pulmonary Clinical Pool Patient will reach out to provider with any new or worsening symptoms Patient/daughter will reach out to RN Care Manager at [phone] with any resource or care management needs        SDOH assessments and interventions completed:  Yes  SDOH Interventions Today    Flowsheet Row Most Recent Value  SDOH Interventions   Transportation Interventions Patient Resources (Friends/Family)        Care Coordination Interventions:  Yes, provided  Interventions Today    Flowsheet Row Most Recent Value  Chronic Disease   Chronic disease during today's visit Chronic Obstructive Pulmonary Disease (COPD)  General Interventions   General Interventions Discussed/Reviewed General Interventions Discussed, General Interventions Reviewed, Doctor Visits, Level of Care, Communication with, Community Resources  Doctor Visits Discussed/Reviewed Specialist, Doctor Visits Discussed, Doctor Visits Reviewed  PCP/Specialist Visits Compliance with follow-up visit  Communication with PCP/Specialists  [Urgent staff message  sent to [facility] Pulmonary Clinical Pool requesting that they reach out to patient's daughter to reschedule visit from 08/19/23 to after CT scan on 09/04/23]  Level of Care Personal Care Services  [Daughter, Joseph Gentry, has talked with PCP office & they have completed and submitted PCS forms. Scheduled for in-home assessment soon.]  Education Interventions   Education Provided Provided Education  Provided Verbal Education On [employer], When to see the doctor  [Discussed [facility] for transportation within [facility] and that they will coordinate transportation with another provider for outside of [place]. Call at least 3 days before appointment to arrange.]     Daughter has been unhappy with Care Management services up to this point and expressed that the interactions have caused more confusion and frustration than help. RNCM listened and discussed concerns.   Follow up plan:  Follow-up call was not scheduled. RN Care Manager will plan to reach out to daughter next week.     Encounter Outcome:  Patient Visit Completed   Demetrios Loll, RN, BSN [place]  [facility], [facility] RN Care Manager Direct Dial: [phone]  (Delayed entry due to [facility] personal illness and absence)

## 2023-08-31 ENCOUNTER — Telehealth: Payer: Self-pay

## 2023-08-31 NOTE — Telephone Encounter (Signed)
 LVM to call office and reschedule CT and F/U appt on 09/04/2023. Provider will not be in the office.

## 2023-09-02 ENCOUNTER — Telehealth: Payer: Self-pay

## 2023-09-02 NOTE — Telephone Encounter (Signed)
 I called the patient Monday 08/31/2023 and today, LVM. Advised f/u appt this Friday 09/04/2023 with Alexandria Lodge, NP needs to be rescheduled due to the provider's schedule change. Advised CT on 09/04/2023 has also been cancelled since patient wanted CT and F/U on the same day do to driving from [place]. Will reschedule both appts when pt's daughter returnes call.

## 2023-09-02 NOTE — Telephone Encounter (Signed)
 LVM again for daughter that appts were cancelled and to please call office and reschedule.

## 2023-09-04 ENCOUNTER — Other Ambulatory Visit: Payer: 59

## 2023-09-04 ENCOUNTER — Ambulatory Visit: Admitting: Acute Care

## 2023-09-14 ENCOUNTER — Telehealth: Payer: Self-pay

## 2023-09-14 NOTE — Telephone Encounter (Signed)
 LVM to call and reschedule CT and F/U appt.

## 2023-09-16 ENCOUNTER — Other Ambulatory Visit: Payer: Self-pay | Admitting: Urology

## 2023-09-21 ENCOUNTER — Other Ambulatory Visit: Payer: 59

## 2023-09-28 ENCOUNTER — Ambulatory Visit: Payer: 59 | Admitting: Urology

## 2023-10-02 ENCOUNTER — Other Ambulatory Visit: Payer: Self-pay | Admitting: Urology

## 2023-10-02 ENCOUNTER — Ambulatory Visit: Payer: 59 | Admitting: Urology

## 2023-10-03 LAB — PSA: Prostate Specific Ag, Serum: 0.3 ng/mL (ref 0.0–4.0)

## 2023-10-05 ENCOUNTER — Ambulatory Visit: Admitting: Primary Care

## 2023-10-12 ENCOUNTER — Other Ambulatory Visit: Payer: 59

## 2023-10-23 ENCOUNTER — Encounter: Payer: Self-pay | Admitting: Urology

## 2023-10-23 ENCOUNTER — Ambulatory Visit: Payer: 59 | Admitting: Urology

## 2023-10-23 VITALS — BP 124/79 | HR 76

## 2023-10-23 DIAGNOSIS — R3912 Poor urinary stream: Secondary | ICD-10-CM | POA: Diagnosis not present

## 2023-10-23 DIAGNOSIS — N401 Enlarged prostate with lower urinary tract symptoms: Secondary | ICD-10-CM | POA: Diagnosis not present

## 2023-10-23 DIAGNOSIS — C61 Malignant neoplasm of prostate: Secondary | ICD-10-CM

## 2023-10-23 DIAGNOSIS — N138 Other obstructive and reflux uropathy: Secondary | ICD-10-CM

## 2023-10-23 LAB — URINALYSIS, ROUTINE W REFLEX MICROSCOPIC
Bilirubin, UA: NEGATIVE
Glucose, UA: NEGATIVE
Ketones, UA: NEGATIVE
Leukocytes,UA: NEGATIVE
Nitrite, UA: NEGATIVE
Protein,UA: NEGATIVE
RBC, UA: NEGATIVE
Specific Gravity, UA: <1.005 — ABNORMAL LOW (ref 1.005–1.030)
Urobilinogen, Ur: 0.2 mg/dL (ref 0.2–1.0)
pH, UA: 6.5 (ref 5.0–7.5)

## 2023-10-23 MED ORDER — TAMSULOSIN HCL 0.4 MG PO CAPS: 0.4000 mg | ORAL_CAPSULE | Freq: Two times a day (BID) | ORAL | 3 refills | Status: DC

## 2023-10-23 NOTE — Patient Instructions (Signed)

## 2023-10-23 NOTE — Progress Notes (Unsigned)
 10/23/2023 10:19 AM   Joseph Gentry Dec 24, 1950 [id number]  Referring provider: Fanta, Tesfaye Demissie, MD [address] [place],  [place] [address]  Followup prostate cancer   HPI: Mr Thoreson is a 73yo here for followup for prostate cancer and BPh with weak urinary stream. PSA 0.3. IPSS 3 QOL 1 on flomax  0.4mg  BID. Urine stream strong. No straining to urinate. Nocturia 1x. He has urinary frequency every 2-3 hours. No other complaints today   PMH: Past Medical History:  Diagnosis Date   Alcoholism (HCC)    Depression    GERD (gastroesophageal reflux disease)    Prostate cancer [facility])     Surgical History: Past Surgical History:  Procedure Laterality Date   NOSE SURGERY      Home Medications:  Allergies as of 10/23/2023   No Known Allergies      Medication List        Accurate as of Oct 23, 2023 10:19 AM. If you have any questions, ask your nurse or doctor.          albuterol  (2.5 MG/3ML) 0.083% nebulizer solution Commonly known as: PROVENTIL  Take 3 mLs (2.5 mg total) by nebulization every 6 (six) hours as needed for wheezing or shortness of breath.   cetirizine  10 MG tablet Commonly known as: ZYRTEC  Take 1 tablet (10 mg total) by mouth daily. What changed:  when to take this reasons to take this   fluticasone  50 MCG/ACT nasal spray Commonly known as: FLONASE  Place 2 sprays into both nostrils daily. What changed:  when to take this reasons to take this   metoprolol tartrate 25 MG tablet Commonly known as: LOPRESSOR Take 25 mg by mouth 2 (two) times daily.   mirtazapine 15 MG tablet Commonly known as: REMERON Take 15 mg by mouth at bedtime.   nicotine 7 mg/24hr patch Commonly known as: NICODERM CQ - dosed in mg/24 hr 7 mg daily.   omeprazole 40 MG capsule Commonly known as: PRILOSEC Take 40 mg by mouth daily.   pantoprazole  40 MG tablet Commonly known as: PROTONIX  Take 40 mg by mouth daily.   Pari LC Plus Nebulizer Misc See  admin instructions.   predniSONE  10 MG tablet Commonly known as: DELTASONE  Take 6 tablets (60 mg total) by mouth daily with breakfast. Take 60 mg/day on day 1 and reduce dose by 10 mg every 2 days until taper is complete   tamsulosin  0.4 MG Caps capsule Commonly known as: FLOMAX  TAKE ONE CAPSULE BY MOUTH TWICE DAILY   tamsulosin  0.4 MG Caps capsule Commonly known as: FLOMAX  TAKE ONE CAPSULE BY MOUTH TWICE DAILY   Trelegy Ellipta  200-62.5-25 MCG/ACT Aepb Generic drug: Fluticasone -Umeclidin-Vilant Inhale 1 puff into the lungs daily.        Allergies: No Known Allergies  Family History: Family History  Problem Relation Age of Onset   Prostate cancer Neg Hx    Colon cancer Neg Hx    Liver disease Neg Hx     Social History:  reports that he has quit smoking. His smoking use included cigarettes. He started smoking about 59 years ago. He has a 29.7 pack-year smoking history. He has never used smokeless tobacco. He reports current alcohol  use of about 9.0 standard drinks of alcohol  per week. He reports that he does not currently use drugs after having used the following drugs: Cocaine and Marijuana.  ROS: All other review of systems were reviewed and are negative except what is noted above in HPI  Physical Exam:  BP 124/79   Pulse 76   Constitutional:  Alert and oriented, No acute distress. HEENT:  AFB AT, moist mucus membranes.  Trachea midline, no masses. Cardiovascular: No clubbing, cyanosis, or edema. Respiratory: Normal respiratory effort, no increased work of breathing. GI: Abdomen is soft, nontender, nondistended, no abdominal masses GU: No CVA tenderness.  Lymph: No cervical or inguinal lymphadenopathy. Skin: No rashes, bruises or suspicious lesions. Neurologic: Grossly intact, no focal deficits, moving all 4 extremities. Psychiatric: Normal mood and affect.  Laboratory Data: Lab Results  Component Value Date   WBC 6.4 12/18/2015   HGB 17.6 (H) 02/03/2022   HCT  51.1 (H) 02/03/2022   MCV 100.4 (H) 02/03/2022   PLT 229 12/18/2015    Lab Results  Component Value Date   CREATININE 1.00 12/18/2015    Lab Results  Component Value Date   PSA <0.1 08/19/2019    Lab Results  Component Value Date   TESTOSTERONE  54 (L) 08/19/2019    No results found for: "HGBA1C"  Urinalysis No results found for: "COLORURINE", "APPEARANCEUR", "LABSPEC", "PHURINE", "GLUCOSEU", "HGBUR", "BILIRUBINUR", "KETONESUR", "PROTEINUR", "UROBILINOGEN", "NITRITE", "LEUKOCYTESUR"  No results found for: "LABMICR", "WBCUA", "RBCUA", "LABEPIT", "MUCUS", "BACTERIA"  Pertinent Imaging:  No results found for this or any previous visit.  No results found for this or any previous visit.  No results found for this or any previous visit.  No results found for this or any previous visit.  Results for orders placed in visit on 04/26/21  Ultrasound renal complete  Narrative CLINICAL DATA:  Left flank pain.  EXAM: RENAL / URINARY TRACT ULTRASOUND COMPLETE  COMPARISON:  None.  FINDINGS: Right Kidney:  Renal measurements: 9.3 x 4.0 x 4.9 cm = volume: 94 mL. Echogenicity within normal limits. No mass or hydronephrosis visualized.  Left Kidney:  Renal measurements: 9.8 x 5.9 x 4.6 cm = volume: 140 mL. Echogenicity within normal limits. No mass or hydronephrosis visualized.  Bladder:  Appears normal for degree of bladder distention.  Other:  There is diffuse increased liver echogenicity most commonly seen in the setting of fatty infiltration. Superimposed inflammation or fibrosis is not excluded. Clinical correlation is recommended.  IMPRESSION: 1. Unremarkable kidneys and urinary bladder. 2. Fatty liver.   Electronically Signed By: Angus Bark M.D. On: 05/09/2021 01:54  No results found for this or any previous visit.  No results found for this or any previous visit.  No results found for this or any previous visit.   Assessment & Plan:     1. Prostate cancer (HCC) (Primary) -followup 1year with PSA - Urinalysis, Routine w reflex microscopic  2. Benign prostatic hyperplasia with urinary obstruction COntinue flomax  0.4mg  daily  3. Weak urinary stream Continue flomax  0.4mg  daily    No follow-ups on file.  Johnie Nailer, MD  [facility]

## 2024-03-30 ENCOUNTER — Encounter (INDEPENDENT_AMBULATORY_CARE_PROVIDER_SITE_OTHER): Payer: Self-pay | Admitting: Gastroenterology

## 2024-04-18 ENCOUNTER — Emergency Department (HOSPITAL_COMMUNITY)

## 2024-04-18 ENCOUNTER — Encounter (HOSPITAL_COMMUNITY): Payer: Self-pay | Admitting: Emergency Medicine

## 2024-04-18 ENCOUNTER — Other Ambulatory Visit: Payer: Self-pay

## 2024-04-18 ENCOUNTER — Inpatient Hospital Stay (HOSPITAL_COMMUNITY)
Admission: EM | Admit: 2024-04-18 | Discharge: 2024-04-21 | DRG: 069 | Disposition: A | Discharge status: Home / Self Care | Attending: Internal Medicine | Admitting: Internal Medicine

## 2024-04-18 DIAGNOSIS — M4856XA Collapsed vertebra, not elsewhere classified, lumbar region, initial encounter for fracture: Secondary | ICD-10-CM | POA: Diagnosis present

## 2024-04-18 DIAGNOSIS — R4182 Altered mental status, unspecified: Principal | ICD-10-CM

## 2024-04-18 DIAGNOSIS — G8929 Other chronic pain: Secondary | ICD-10-CM | POA: Diagnosis present

## 2024-04-18 DIAGNOSIS — R296 Repeated falls: Secondary | ICD-10-CM | POA: Diagnosis present

## 2024-04-18 DIAGNOSIS — J9611 Chronic respiratory failure with hypoxia: Secondary | ICD-10-CM | POA: Diagnosis present

## 2024-04-18 DIAGNOSIS — R471 Dysarthria and anarthria: Secondary | ICD-10-CM | POA: Diagnosis present

## 2024-04-18 DIAGNOSIS — G459 Transient cerebral ischemic attack, unspecified: Secondary | ICD-10-CM | POA: Diagnosis not present

## 2024-04-18 DIAGNOSIS — Z9981 Dependence on supplemental oxygen: Secondary | ICD-10-CM

## 2024-04-18 DIAGNOSIS — W19XXXA Unspecified fall, initial encounter: Secondary | ICD-10-CM | POA: Diagnosis present

## 2024-04-18 DIAGNOSIS — S0083XA Contusion of other part of head, initial encounter: Secondary | ICD-10-CM | POA: Diagnosis present

## 2024-04-18 DIAGNOSIS — N400 Enlarged prostate without lower urinary tract symptoms: Secondary | ICD-10-CM | POA: Diagnosis present

## 2024-04-18 DIAGNOSIS — I10 Essential (primary) hypertension: Secondary | ICD-10-CM | POA: Diagnosis present

## 2024-04-18 DIAGNOSIS — Z8546 Personal history of malignant neoplasm of prostate: Secondary | ICD-10-CM

## 2024-04-18 DIAGNOSIS — J449 Chronic obstructive pulmonary disease, unspecified: Secondary | ICD-10-CM | POA: Diagnosis not present

## 2024-04-18 DIAGNOSIS — Z7982 Long term (current) use of aspirin: Secondary | ICD-10-CM

## 2024-04-18 DIAGNOSIS — Z23 Encounter for immunization: Secondary | ICD-10-CM

## 2024-04-18 DIAGNOSIS — M5416 Radiculopathy, lumbar region: Secondary | ICD-10-CM | POA: Diagnosis present

## 2024-04-18 DIAGNOSIS — M2578 Osteophyte, vertebrae: Secondary | ICD-10-CM | POA: Diagnosis present

## 2024-04-18 DIAGNOSIS — R718 Other abnormality of red blood cells: Secondary | ICD-10-CM | POA: Diagnosis not present

## 2024-04-18 DIAGNOSIS — Y908 Blood alcohol level of 240 mg/100 ml or more: Secondary | ICD-10-CM | POA: Diagnosis present

## 2024-04-18 DIAGNOSIS — Z720 Tobacco use: Secondary | ICD-10-CM

## 2024-04-18 DIAGNOSIS — F1010 Alcohol abuse, uncomplicated: Secondary | ICD-10-CM | POA: Diagnosis present

## 2024-04-18 DIAGNOSIS — K219 Gastro-esophageal reflux disease without esophagitis: Secondary | ICD-10-CM | POA: Diagnosis present

## 2024-04-18 DIAGNOSIS — M48061 Spinal stenosis, lumbar region without neurogenic claudication: Secondary | ICD-10-CM | POA: Diagnosis present

## 2024-04-18 DIAGNOSIS — F32A Depression, unspecified: Secondary | ICD-10-CM | POA: Diagnosis present

## 2024-04-18 LAB — PROTIME-INR
INR: 0.9 (ref 0.8–1.2)
Prothrombin Time: 12.7 s (ref 11.4–15.2)

## 2024-04-18 LAB — CBC
HCT: 44.2 % (ref 39.0–52.0)
Hemoglobin: 15.3 g/dL (ref 13.0–17.0)
MCH: 34.8 pg — ABNORMAL HIGH (ref 26.0–34.0)
MCHC: 34.6 g/dL (ref 30.0–36.0)
MCV: 100.5 fL — ABNORMAL HIGH (ref 80.0–100.0)
Platelets: 286 K/uL (ref 150–400)
RBC: 4.4 MIL/uL (ref 4.22–5.81)
RDW: 14.2 % (ref 11.5–15.5)
WBC: 8.6 K/uL (ref 4.0–10.5)
nRBC: 0 % (ref 0.0–0.2)

## 2024-04-18 LAB — DIFFERENTIAL
Abs Immature Granulocytes: 0.02 K/uL (ref 0.00–0.07)
Basophils Absolute: 0.1 K/uL (ref 0.0–0.1)
Basophils Relative: 1 %
Eosinophils Absolute: 0.5 K/uL (ref 0.0–0.5)
Eosinophils Relative: 6 %
Immature Granulocytes: 0 %
Lymphocytes Relative: 30 %
Lymphs Abs: 2.5 K/uL (ref 0.7–4.0)
Monocytes Absolute: 0.5 K/uL (ref 0.1–1.0)
Monocytes Relative: 6 %
Neutro Abs: 4.7 K/uL (ref 1.7–7.7)
Neutrophils Relative %: 57 %

## 2024-04-18 LAB — MAGNESIUM: Magnesium: 1.7 mg/dL (ref 1.7–2.4)

## 2024-04-18 LAB — COMPREHENSIVE METABOLIC PANEL WITH GFR
ALT: 10 U/L (ref 0–44)
AST: 19 U/L (ref 15–41)
Albumin: 4 g/dL (ref 3.5–5.0)
Alkaline Phosphatase: 96 U/L (ref 38–126)
Anion gap: 14 (ref 5–15)
BUN: <5 mg/dL — ABNORMAL LOW (ref 8–23)
CO2: 23 mmol/L (ref 22–32)
Calcium: 8.7 mg/dL — ABNORMAL LOW (ref 8.9–10.3)
Chloride: 98 mmol/L (ref 98–111)
Creatinine, Ser: 0.69 mg/dL (ref 0.61–1.24)
GFR, Estimated: >60 mL/min (ref >60)
Glucose, Bld: 97 mg/dL (ref 70–99)
Potassium: 3.4 mmol/L — ABNORMAL LOW (ref 3.5–5.1)
Sodium: 134 mmol/L — ABNORMAL LOW (ref 135–145)
Total Bilirubin: 0.3 mg/dL (ref 0.0–1.2)
Total Protein: 6.5 g/dL (ref 6.5–8.1)

## 2024-04-18 LAB — ETHANOL: Alcohol, Ethyl (B): 256 mg/dL — ABNORMAL HIGH (ref <15)

## 2024-04-18 LAB — URINE DRUG SCREEN
Amphetamines: NEGATIVE
Barbiturates: NEGATIVE
Benzodiazepines: NEGATIVE
Cocaine: NEGATIVE
Fentanyl: NEGATIVE
Methadone Scn, Ur: NEGATIVE
Opiates: NEGATIVE
Tetrahydrocannabinol: NEGATIVE

## 2024-04-18 LAB — I-STAT CHEM 8, ED
BUN: <3 mg/dL — ABNORMAL LOW (ref 8–23)
Calcium, Ion: 1.19 mmol/L (ref 1.15–1.40)
Chloride: 99 mmol/L (ref 98–111)
Creatinine, Ser: 1 mg/dL (ref 0.61–1.24)
Glucose, Bld: 128 mg/dL — ABNORMAL HIGH (ref 70–99)
HCT: 48 % (ref 39.0–52.0)
Hemoglobin: 16.3 g/dL (ref 13.0–17.0)
Potassium: 3.9 mmol/L (ref 3.5–5.1)
Sodium: 137 mmol/L (ref 135–145)
TCO2: 23 mmol/L (ref 22–32)

## 2024-04-18 LAB — TROPONIN T, HIGH SENSITIVITY
Troponin T High Sensitivity: <15 ng/L (ref 0–19)
Troponin T High Sensitivity: <15 ng/L (ref 0–19)

## 2024-04-18 LAB — CBG MONITORING, ED: Glucose-Capillary: 126 mg/dL — ABNORMAL HIGH (ref 70–99)

## 2024-04-18 LAB — APTT: aPTT: 28 s (ref 24–36)

## 2024-04-18 MED ORDER — MAGNESIUM SULFATE 2 GM/50ML IV SOLN
2.0000 g | Freq: Once | INTRAVENOUS | Status: AC
  Administered 2024-04-18: 2 g via INTRAVENOUS
  Filled 2024-04-18: qty 50

## 2024-04-18 MED ORDER — ACETAMINOPHEN 650 MG RE SUPP: 650.0000 mg | Freq: Four times a day (QID) | RECTAL | Status: DC | PRN

## 2024-04-18 MED ORDER — ALBUTEROL SULFATE (2.5 MG/3ML) 0.083% IN NEBU: 2.5000 mg | INHALATION_SOLUTION | RESPIRATORY_TRACT | Status: DC | PRN

## 2024-04-18 MED ORDER — THIAMINE HCL 100 MG/ML IJ SOLN: 250.0000 mg | Freq: Every day | INTRAVENOUS | Status: DC

## 2024-04-18 MED ORDER — THIAMINE MONONITRATE 100 MG PO TABS: 100.0000 mg | ORAL_TABLET | Freq: Every day | ORAL | Status: DC

## 2024-04-18 MED ORDER — THIAMINE HCL 100 MG/ML IJ SOLN
500.0000 mg | Freq: Three times a day (TID) | INTRAVENOUS | Status: AC
Start: 2024-04-18 — End: 2024-04-21
  Administered 2024-04-18 – 2024-04-20 (×6): 500 mg via INTRAVENOUS
  Filled 2024-04-18 (×6): qty 5

## 2024-04-18 MED ORDER — ASPIRIN 325 MG PO TABS
325.0000 mg | ORAL_TABLET | Freq: Once | ORAL | Status: AC
  Administered 2024-04-18: 325 mg via ORAL
  Filled 2024-04-18: qty 1

## 2024-04-18 MED ORDER — THIAMINE HCL 100 MG/ML IJ SOLN: 500.0000 mg | Freq: Three times a day (TID) | INTRAVENOUS | Status: DC

## 2024-04-18 MED ORDER — ALBUTEROL SULFATE HFA 108 (90 BASE) MCG/ACT IN AERS: 2.0000 | INHALATION_SPRAY | RESPIRATORY_TRACT | Status: DC | PRN

## 2024-04-18 MED ORDER — IOHEXOL 350 MG/ML SOLN
100.0000 mL | Freq: Once | INTRAVENOUS | Status: AC | PRN
  Administered 2024-04-18: 100 mL via INTRAVENOUS

## 2024-04-18 MED ORDER — ONDANSETRON HCL 4 MG PO TABS: 4.0000 mg | ORAL_TABLET | Freq: Four times a day (QID) | ORAL | Status: DC | PRN

## 2024-04-18 MED ORDER — PANTOPRAZOLE SODIUM 40 MG PO TBEC
40.0000 mg | DELAYED_RELEASE_TABLET | Freq: Every day | ORAL | Status: DC
  Administered 2024-04-19 – 2024-04-21 (×3): 40 mg via ORAL
  Filled 2024-04-18 (×3): qty 1

## 2024-04-18 MED ORDER — TAMSULOSIN HCL 0.4 MG PO CAPS
0.4000 mg | ORAL_CAPSULE | Freq: Two times a day (BID) | ORAL | Status: DC
  Administered 2024-04-18 – 2024-04-21 (×6): 0.4 mg via ORAL
  Filled 2024-04-18 (×6): qty 1

## 2024-04-18 MED ORDER — BUDESON-GLYCOPYRROL-FORMOTEROL 160-9-4.8 MCG/ACT IN AERO
2.0000 | INHALATION_SPRAY | Freq: Two times a day (BID) | RESPIRATORY_TRACT | Status: DC
Start: 2024-04-18 — End: 2024-04-21
  Administered 2024-04-18 – 2024-04-21 (×6): 2 via RESPIRATORY_TRACT
  Filled 2024-04-18: qty 5.9

## 2024-04-18 MED ORDER — STROKE: EARLY STAGES OF RECOVERY BOOK
Freq: Once | Status: AC
  Filled 2024-04-18: qty 1

## 2024-04-18 MED ORDER — ONDANSETRON HCL 4 MG/2ML IJ SOLN: 4.0000 mg | Freq: Four times a day (QID) | INTRAMUSCULAR | Status: DC | PRN

## 2024-04-18 MED ORDER — ACETAMINOPHEN 325 MG PO TABS
650.0000 mg | ORAL_TABLET | Freq: Four times a day (QID) | ORAL | Status: DC | PRN
  Administered 2024-04-21: 650 mg via ORAL
  Filled 2024-04-18: qty 2

## 2024-04-18 NOTE — ED Notes (Signed)
 Returned from CT via stretcher. Pulse Ox trending 88 - 90% on room air. Started pt on O2 at 2L per [place]. SaO2 increased to 95%.

## 2024-04-18 NOTE — H&P (Signed)
 History and Physical    Patient: Joseph Gentry FMW:[id number] DOB: 03/15/1951 DOA: 04/18/2024 DOS: the patient was seen and examined on 04/18/2024 PCP: Carlette Benita Area, MD  Patient coming from: Home  Chief Complaint:  Chief Complaint  Patient presents with   Code Stroke   HPI: Joseph Gentry is a 73 y.o. male with medical history significant of COPD, GERD, alcohol  abuse, BPH, tobacco abuse who presents emergency department via EMS due to a fall and loss of consciousness.  Patient was unable to provide history, history was obtained from EDP and ED medical record.  Per report, patient was seen in his normal state about 25 minutes PTA.  He had an unwitnessed fall close to his apartment whereby he was minimally responsive.  EMS was activated and on arrival of EMS team, he was noted to present with slowed speech and decreased responsiveness.  CBG checked was 112.  ED course In the emergency department, temperature was 97.4 F, other vital signs were within normal range.  Workup in the ED showed normal CBC except for MCV of 100.5.  BMP was normal except for sodium of 134, potassium 3.4, alcohol  level 256, troponin <15, magnesium 1.7 CT head without contrast showed no acute intracranial abnormality CT angiography of head and neck showed no acute LVO. CT cervical spine without contrast showed no acute cervical spine fracture Teleneurology was consulted and recommended aspirin 324 mg p.o. x 1 and Wernicke dose thiamine as well as further stroke workup.  Review of Systems: As mentioned in the history of present illness. All other systems reviewed and are negative. Past Medical History:  Diagnosis Date   Alcoholism (HCC)    Depression    GERD (gastroesophageal reflux disease)    Prostate cancer [facility])    Past Surgical History:  Procedure Laterality Date   NOSE SURGERY     Social History:  reports that he has quit smoking. His smoking use included cigarettes. He started smoking about 59  years ago. He has a 29.9 pack-year smoking history. He has never used smokeless tobacco. He reports current alcohol  use of about 9.0 standard drinks of alcohol  per week. He reports that he does not currently use drugs after having used the following drugs: Cocaine and Marijuana.  No Known Allergies  Family History  Problem Relation Age of Onset   Prostate cancer Neg Hx    Colon cancer Neg Hx    Liver disease Neg Hx     Prior to Admission medications   Medication Sig Start Date End Date Taking? Authorizing Provider  albuterol  (VENTOLIN  HFA) 108 (90 Base) MCG/ACT inhaler Inhale 2 puffs into the lungs every 4 (four) hours as needed. 04/11/24  Yes [provider]  cetirizine  (ZYRTEC ) 10 MG tablet Take 1 tablet (10 mg total) by mouth daily. Patient taking differently: Take 10 mg by mouth daily as needed for allergies. 10/26/19  Yes Gretta Doffing P, DO  fluticasone  (FLONASE ) 50 MCG/ACT nasal spray Place 2 sprays into both nostrils daily. Patient taking differently: Place 2 sprays into both nostrils daily as needed for allergies or rhinitis. 10/26/19  Yes Gretta Doffing P, DO  metoprolol tartrate (LOPRESSOR) 25 MG tablet Take 25 mg by mouth 2 (two) times daily. 09/01/22  Yes [provider]  mirtazapine (REMERON) 15 MG tablet Take 15 mg by mouth at bedtime. 07/28/19  Yes [provider]  pantoprazole  (PROTONIX ) 40 MG tablet Take 40 mg by mouth daily. 06/29/23  Yes [provider]  tamsulosin  (FLOMAX )  0.4 MG CAPS capsule Take 1 capsule (0.4 mg total) by mouth 2 (two) times daily. 10/23/23  Yes McKenzie, Belvie CROME, MD  TRELEGY ELLIPTA  100-62.5-25 MCG/ACT AEPB Inhale 1 puff into the lungs daily. 04/11/24  Yes [provider]  albuterol  (PROVENTIL ) (2.5 MG/3ML) 0.083% nebulizer solution Take 3 mLs (2.5 mg total) by nebulization every 6 (six) hours as needed for wheezing or shortness of breath. 08/19/21   Desai, Nikita S, MD  Fluticasone -Umeclidin-Vilant (TRELEGY ELLIPTA )  200-62.5-25 MCG/ACT AEPB Inhale 1 puff into the lungs daily. Patient not taking: Reported on 04/18/2024 07/21/23 07/20/24  Mannam, Praveen, MD  Nebulizers (PARI LC PLUS NEBULIZER) MISC See admin instructions. 06/13/23   [provider]  nicotine (NICODERM CQ - DOSED IN MG/24 HR) 7 mg/24hr patch 7 mg daily. 06/29/23   [provider]  omeprazole (PRILOSEC) 40 MG capsule Take 40 mg by mouth daily.    [provider]  tamsulosin  (FLOMAX ) 0.4 MG CAPS capsule TAKE ONE CAPSULE BY MOUTH TWICE DAILY Patient not taking: Reported on 04/18/2024 09/21/23   Sherrilee Belvie CROME, MD    Physical Exam: Vitals:   04/18/24 1915 04/18/24 1930 04/18/24 1945 04/18/24 2000  BP: 119/84 123/81 125/83 114/82  Pulse: 74 75 74 74  Resp: 13 11 13 11   Temp:      TempSrc:      SpO2: 97% 96% 94% 96%  Weight:      Height:       General: Elderly male. Awake and alert and oriented x 2. Not in any acute distress.  HEENT: Right forehead contusion.  PERRLA. EOMI. Sclerae anicteric.  Dry mucosal membranes. Neck: Neck supple without lymphadenopathy. No carotid bruits. No masses palpated.  Cardiovascular: Regular rate with normal S1-S2 sounds. No murmurs, rubs or gallops auscultated. No JVD.  Respiratory: Clear breath sounds.  No accessory muscle use. Abdomen: Soft, nontender, nondistended. Active bowel sounds. No masses or hepatosplenomegaly  Skin: No rashes, lesions, or ulcerations.  Dry, warm to touch. Musculoskeletal:  2+ dorsalis pedis and radial pulses. Good ROM.  No contractures  Psychiatric: Intact judgment and insight.  Mood appropriate to current condition. Neurologic: Mild slurred speech.  Minimal left leg motor drift, right leg motor drift with no effort against gravity (patient with chronic hip pain)  Data Reviewed: EKG personally reviewed showed normal sinus rhythm at a rate of 76 bpm  Assessment and Plan: Dysarthria rule out acute ischemic stroke Patient will be admitted to telemetry  unit  CT head without contrast showed no acute intracranial abnormality CT angiography of head and neck showed no acute LVO. Echocardiogram in the morning MRI of brain without contrast in the morning Aspirin 325 mg was given in the ED, continue aspirin 81 mg p.o. daily Continue IV thiamine 500 mg X 3 days, to 50 mg daily X 5 days, then 100 mg daily per teleneurologist's recommendation Continue fall precautions and neuro checks Lipid panel and hemoglobin A1c will be checked Continue PT/SLP/OT eval and treat Bedside swallow eval by nursing prior to diet Neurology will be consulted and we shall await further recommendation  Right forehead contusion due to a fall Continue fall precaution Continue PT/OT eval and treat  Elevated MCV MCV 100.5, vitamin B12 folate levels will be checked  COPD (not in acute exacerbation) Continue Ventolin , Breztri  GERD Continue Protonix   BPH Continue Flomax   Essential hypertension  Antihypertensives PRN if Blood pressure is greater than 220/120 or there is a concern for End organ damage/contraindications for permissive HTN. If  blood pressure is greater than 220/120 give labetalol PO or IV or Vasotec IV with a goal of 15% reduction in BP during the first 24 hours.  Tobacco abuse Patient was counseled on tobacco abuse cessation   Advance Care Planning: Full code  Consults: Neurology  Family Communication: Son-in-law and daughter (on phone) -all questions answered to satisfaction  Severity of Illness: The appropriate patient status for this patient is OBSERVATION. Observation status is judged to be reasonable and necessary in order to provide the required intensity of service to ensure the patient's safety. The patient's presenting symptoms, physical exam findings, and initial radiographic and laboratory data in the context of their medical condition is felt to place them at decreased risk for further clinical deterioration. Furthermore, it is  anticipated that the patient will be medically stable for discharge from the hospital within 2 midnights of admission.   Author: Vance Belcourt, DO 04/18/2024 8:03 PM  For on call review [email or web address].

## 2024-04-18 NOTE — ED Notes (Signed)
C-collar removed per MD Dixon ?

## 2024-04-18 NOTE — ED Triage Notes (Signed)
 Pt had witnessed fall at apartment complex by neighbors. Per bystanders he is heavy drinking and was last well known about 30 minutes ago. Pt has slurred speech and smells of etoh.

## 2024-04-18 NOTE — ED Provider Notes (Signed)
 [place] EMERGENCY DEPARTMENT AT [facility] Provider Note   CSN: [id number] Arrival date & time: 04/18/24  1821  An emergency department physician performed an initial assessment on this suspected stroke patient at 35.  Patient presents with: Code Stroke   Joseph Gentry is a 73 y.o. male.   HPI Presents for altered mental status.  Medical history includes alcohol  abuse, BPH, anemia, GERD, depression.  Per EMS, bystanders saw him in his normal state of health 25 minutes prior to arrival.  He had an unwitnessed fall outside, close to his apartment.  He is minimally responsive on the ground.  EMS noticed slurred speech and decreased responsiveness.  CBG was 112.    Prior to Admission medications   Medication Sig Start Date End Date Taking? Authorizing Provider  albuterol  (PROVENTIL ) (2.5 MG/3ML) 0.083% nebulizer solution Take 3 mLs (2.5 mg total) by nebulization every 6 (six) hours as needed for wheezing or shortness of breath. 08/19/21  Yes Meade Verdon RAMAN, MD  albuterol  (VENTOLIN  HFA) [phone] Base) MCG/ACT inhaler Inhale 2 puffs into the lungs every 4 (four) hours as needed. 04/11/24  Yes [provider]  cetirizine  (ZYRTEC ) 10 MG tablet Take 1 tablet (10 mg total) by mouth daily. Patient taking differently: Take 10 mg by mouth daily as needed for allergies. 10/26/19  Yes Gretta Leita SQUIBB, DO  fluticasone  (FLONASE ) 50 MCG/ACT nasal spray Place 2 sprays into both nostrils daily. Patient taking differently: Place 2 sprays into both nostrils daily as needed for allergies or rhinitis. 10/26/19  Yes Gretta Leita P, DO  metoprolol tartrate (LOPRESSOR) 25 MG tablet Take 25 mg by mouth 2 (two) times daily. 09/01/22  Yes [provider]  mirtazapine (REMERON) 15 MG tablet Take 15 mg by mouth at bedtime. 07/28/19  Yes [provider]  OXYGEN Inhale 2 L into the lungs daily.   Yes [provider]  pantoprazole  (PROTONIX ) 40 MG tablet Take 40 mg by mouth  daily. 06/29/23  Yes [provider]  tamsulosin  (FLOMAX ) 0.4 MG CAPS capsule Take 1 capsule (0.4 mg total) by mouth 2 (two) times daily. 10/23/23  Yes McKenzie, Belvie CROME, MD  TRELEGY ELLIPTA  100-62.5-25 MCG/ACT AEPB Inhale 1 puff into the lungs daily. 04/11/24  Yes [provider]  Fluticasone -Umeclidin-Vilant (TRELEGY ELLIPTA ) 200-62.5-25 MCG/ACT AEPB Inhale 1 puff into the lungs daily. Patient not taking: Reported on 04/18/2024 07/21/23 07/20/24  Mannam, Praveen, MD  tamsulosin  (FLOMAX ) 0.4 MG CAPS capsule TAKE ONE CAPSULE BY MOUTH TWICE DAILY Patient not taking: Reported on 04/18/2024 09/21/23   Sherrilee Belvie CROME, MD    Allergies: Patient has no known allergies.    Review of Systems  Unable to perform ROS: Mental status change    Updated Vital Signs BP 118/81   Pulse 75   Temp (!) 97.4 F (36.3 C) (Oral)   Resp 10   Ht 5' 8 (1.727 m)   Wt 69.6 kg   SpO2 94%   BMI 23.33 kg/m   Physical Exam Vitals and nursing note reviewed.  Constitutional:      General: He is not in acute distress.    Appearance: Normal appearance. He is well-developed. He is ill-appearing. He is not toxic-appearing or diaphoretic.  HENT:     Head: Normocephalic.     Comments: Abrasion to right frontal scalp    Right Ear: External ear normal.     Left Ear: External ear normal.     Nose: Nose normal.  Mouth/Throat:     Mouth: Mucous membranes are moist.  Eyes:     Extraocular Movements: Extraocular movements intact.     Conjunctiva/sclera: Conjunctivae normal.  Neck:     Comments: Cervical collar in place Cardiovascular:     Rate and Rhythm: Normal rate and regular rhythm.     Heart sounds: No murmur heard. Pulmonary:     Effort: Pulmonary effort is normal. No respiratory distress.     Breath sounds: Normal breath sounds. No wheezing.  Chest:     Chest wall: No tenderness.  Abdominal:     General: There is no distension.     Palpations: Abdomen is soft.     Tenderness: There is  no abdominal tenderness.  Musculoskeletal:        General: No swelling or deformity.     Cervical back: Neck supple.  Skin:    General: Skin is warm and dry.  Neurological:     Mental Status: He is lethargic.     GCS: GCS eye subscore is 4. GCS verbal subscore is 2. GCS motor subscore is 6.     Cranial Nerves: Dysarthria present. No facial asymmetry.     Motor: Weakness (Global) present.     (all labs ordered are listed, but only abnormal results are displayed) Labs Reviewed  CBC - Abnormal; Notable for the following components:      Result Value   MCV 100.5 (*)    MCH 34.8 (*)    All other components within normal limits  COMPREHENSIVE METABOLIC PANEL WITH GFR - Abnormal; Notable for the following components:   Sodium 134 (*)    Potassium 3.4 (*)    BUN <5 (*)    Calcium 8.7 (*)    All other components within normal limits  ETHANOL - Abnormal; Notable for the following components:   Alcohol , Ethyl (B) 256 (*)    All other components within normal limits  I-STAT CHEM 8, ED - Abnormal; Notable for the following components:   BUN <3 (*)    Glucose, Bld 128 (*)    All other components within normal limits  CBG MONITORING, ED - Abnormal; Notable for the following components:   Glucose-Capillary 126 (*)    All other components within normal limits  PROTIME-INR  APTT  DIFFERENTIAL  MAGNESIUM  URINE DRUG SCREEN  VITAMIN B1  LIPID PANEL  HEMOGLOBIN A1C  VITAMIN B12  FOLATE  TROPONIN T, HIGH SENSITIVITY  TROPONIN T, HIGH SENSITIVITY    EKG: EKG Interpretation Date/Time:  Monday April 18 2024 18:44:15 EST Ventricular Rate:  71 PR Interval:  206 QRS Duration:  108 QT Interval:  446 QTC Calculation: 485 R Axis:   20  Text Interpretation: Sinus rhythm Borderline low voltage, extremity leads Borderline prolonged QT interval Confirmed by Melvenia Motto [phone]) on 04/18/2024 7:18:35 PM  Radiology: CT ANGIO HEAD NECK W WO CM W PERF (CODE STROKE) Result Date:  04/18/2024 EXAM: CT ANGIOGRAPHY OF THE HEAD AND NECK CT PERFUSION BRAIN 04/18/2024 07:16:13 PM TECHNIQUE: Multidetector CT imaging of the head and neck was performed using the standard protocol during bolus administration of intravenous contrast. 3D postprocessing with multiplanar reconstructions and MIPs was performed to evaluate the vascular anatomy. Carotid stenosis measurements (when applicable) are obtained utilizing NASCET criteria, using the distal internal carotid diameter as the denominator. Cerebral perfusion analysis using computed tomography with contrast administration, including post-processing of parametric maps with determination of cerebral blood flow, cerebral blood volume, mean transit time and time-to-maximum. RADIATION  DOSE REDUCTION: This exam was performed according to the departmental dose-optimization program which includes automated exposure control, adjustment of the mA and/or kV according to patient size and/or use of iterative reconstruction technique. CONTRAST: Without and with IV contrast, 100mL (iohexol (OMNIPAQUE) 350 MG/ML injection 100 mL IOHEXOL 350 MG/ML SOLN) COMPARISON: Same day CT head. CLINICAL HISTORY: Neuro deficit, acute, stroke suspected FINDINGS: AORTIC ARCH AND ARCH VESSELS: No dissection or arterial injury. No significant stenosis of the brachiocephalic or subclavian arteries. CERVICAL CAROTID ARTERIES: No dissection, arterial injury, or hemodynamically significant stenosis by NASCET criteria. CERVICAL VERTEBRAL ARTERIES: No dissection, arterial injury, or significant stenosis. LUNGS AND MEDIASTINUM: Unremarkable. SOFT TISSUES: No acute abnormality. BONES: No acute abnormality. ANTERIOR CIRCULATION: No significant stenosis of the internal carotid arteries. No significant stenosis of the anterior cerebral arteries. No significant stenosis of the middle cerebral arteries. No aneurysm. POSTERIOR CIRCULATION: No significant stenosis of the posterior cerebral arteries. No  significant stenosis of the basilar artery. No significant stenosis of the vertebral arteries. No aneurysm. OTHER: No dural venous sinus thrombosis on this non-dedicated study. EXAM QUALITY: Exam quality is adequate with diagnostic perfusion maps. No significant motion artifact. Appropriate arterial inflow and venous outflow curves. CORE INFARCT (CBF<30% volume): 0 mL TOTAL HYPOPERFUSION (Tmax>6s volume): 9 mL Mismatch volume: 9 mL Mismatch ratio: infinite Location: Cerebellum IMPRESSION: 1. No acute large vessel occlusion. 2. No hemodynamically significant stenosis. 3. On CT perfusion, 9 mL of reported penumbra in the cerebellum which is indeterminate but favored artifactual. MRI could better evaluate for acute infarct if clinically warranted. Electronically signed by: Gilmore Molt MD 04/18/2024 07:50 PM EST RP Workstation: [id number]   CT Cervical Spine Wo Contrast Result Date: 04/18/2024 EXAM: CT CERVICAL SPINE WITHOUT CONTRAST 04/18/2024 06:43:38 PM TECHNIQUE: CT of the cervical spine was performed without the administration of intravenous contrast. Multiplanar reformatted images are provided for review. Automated exposure control, iterative reconstruction, and/or weight based adjustment of the mA/kV was utilized to reduce the radiation dose to as low as reasonably achievable. COMPARISON: None available. CLINICAL HISTORY: Neck trauma (Age >= 65y). FINDINGS: CERVICAL SPINE: BONES AND ALIGNMENT: Straightening/mild reversal of the normal cervical lordosis. Grade 1 anterolisthesis of C3 on C4, C4 on C5, and C7 on T1. No acute fracture or suspicious lesion. DEGENERATIVE CHANGES: Diffuse cervical disc degeneration, with disc space narrowing and degenerative endplate changes being greatest at C5-C6 and C6-C7. Asymmetrically advanced right facet arthrosis at C2-C3 and C3-C4 and left facet arthrosis at C4-C5 with resultant neural foraminal stenosis at these levels. No evidence of high grade spinal canal stenosis.  SOFT TISSUES: No prevertebral soft tissue swelling. Emphysema in the included lung apices. IMPRESSION: 1. No acute cervical spine fracture. 2. Advanced cervical disc and facet degeneration. 3. Emphysema; consider evaluation for eligibility for low-dose CT lung cancer screening program. Electronically signed by: Dasie Hamburg MD 04/18/2024 06:59 PM EST RP Workstation: [id number]   CT HEAD CODE STROKE WO CONTRAST (LKW 0-4.5h, LVO 0-24h) Result Date: 04/18/2024 EXAM: CT HEAD WITHOUT 04/18/2024 06:43:38 PM TECHNIQUE: CT of the head was performed without the administration of intravenous contrast. Automated exposure control, iterative reconstruction, and/or weight based adjustment of the mA/kV was utilized to reduce the radiation dose to as low as reasonably achievable. COMPARISON: None available. CLINICAL HISTORY: Neuro deficit, acute, stroke suspected. Fall and slurred speech. FINDINGS: BRAIN AND VENTRICLES: There is no evidence of an acute infarct, intracranial hemorrhage, mass, midline shift, hydrocephalus, or extra-axial fluid collection. Confluent hypodensities in the cerebral white matter bilaterally are nonspecific but  compatible with severe chronic small vessel ischemic disease. There is mild to moderate central predominant cerebral atrophy. Calcified atherosclerosis at the skull base. ORBITS: No acute abnormality. SINUSES AND MASTOIDS: Partially visualized chronic deformities of the posterior walls of the left greater than right maxillary sinuses. Mild bilateral ethmoid sinus mucosal thickening. Clear mastoid air cells. SOFT TISSUES AND SKULL: No acute skull fracture. Mild right frontal scalp soft tissue swelling. [place] Stroke Program Early CT Score (ASPECTS) ----- Ganglionic (caudate, IC, lentiform nucleus, insula, M1-M3): 7 Supraganglionic (M4-M6): 3 Total: 10 Findings were communicated by telephone to Dr. Bernardino Fireman on 04/18/2024 at 6:49 pm. IMPRESSION: 1. No acute intracranial abnormality. ASPECTS of 10.  2. Severe chronic small vessel ischemic disease. Electronically signed by: Dasie Hamburg MD 04/18/2024 06:52 PM EST RP Workstation: [id number]     Procedures   Medications Ordered in the ED  thiamine (VITAMIN B1) 500 mg in sodium chloride  0.9 % 50 mL IVPB (has no administration in time range)  magnesium sulfate IVPB 2 g 50 mL (has no administration in time range)   stroke: early stages of recovery book (has no administration in time range)  thiamine (VITAMIN B1) 500 mg in sodium chloride  0.9 % 50 mL IVPB (has no administration in time range)  thiamine (VITAMIN B1) 250 mg in sodium chloride  0.9 % 50 mL IVPB (has no administration in time range)  thiamine (VITAMIN B1) tablet 100 mg (has no administration in time range)  iohexol (OMNIPAQUE) 350 MG/ML injection 100 mL (100 mLs Intravenous Contrast Given 04/18/24 1857)  aspirin tablet 325 mg (325 mg Oral Given 04/18/24 1940)                                    Medical Decision Making Amount and/or Complexity of Data Reviewed Labs: ordered. Radiology: ordered.  Risk OTC drugs. Decision regarding hospitalization.   This patient presents to the ED for concern of altered mental status, this involves an extensive number of treatment options, and is a complaint that carries with it a high risk of complications and morbidity.  The differential diagnosis includes intoxication, polypharmacy, withdrawal, CVA, ICH, seizure, metabolic derangements   Co morbidities / Chronic conditions that complicate the patient evaluation  alcohol  abuse, BPH, anemia, GERD, depression   Additional history obtained:  Additional history obtained from EMR External records from outside source obtained and reviewed including EMS   Lab Tests:  I Ordered, and personally interpreted labs.  The pertinent results include: Normal hemoglobin, no leukocytosis, normal kidney function, normal electrolytes, elevated ethanol level   Imaging Studies ordered:  I ordered  imaging studies including CT of head and cervical spine I independently visualized and interpreted imaging which showed no acute findings I agree with the radiologist interpretation   Cardiac Monitoring: / EKG:  The patient was maintained on a cardiac monitor.  I personally viewed and interpreted the cardiac monitored which showed an underlying rhythm of: Sinus rhythm   Problem List / ED Course / Critical interventions / Medication management  Patient presenting for fall and altered mental status.  Per EMS, bystanders saw him in his normal state of health 25 minutes prior to arrival.  He was unresponsive after his fall.  He has been minimally responsive with EMS.  On arrival, patient has unintelligible speech, global weakness.  He is able to follow some commands but unable to participate fully in a neurological exam.  Given timeline, code stroke  was initiated.  Noncontrasted CT imaging did not show any acute findings.  I spoke with neurologist on-call, Dr. Delilah, who recommends admission here at [facility], 325 of ASA, MRI in the morning, and high-dose thiamine.  Patient was admitted for further management. I ordered medication including ASA for possible stroke, thiamine for empiric treatment of Warnicke's encephalopathy Reevaluation of the patient after these medicines showed that the patient stayed the same I have reviewed the patients home medicines and have made adjustments as needed   Consultations Obtained:  I requested consultation with the neurologist, Dr. Floyce,  and discussed lab and imaging findings as well as pertinent plan - they recommend: Admission here at [facility] for further stroke workup   Social Determinants of Health:  Reported ongoing alcohol  abuse     Final diagnoses:  Altered mental status, unspecified altered mental status type    ED Discharge Orders     None          Melvenia Motto, MD 04/18/24 2131

## 2024-04-18 NOTE — Consult Note (Addendum)
 [facility] Consult Services   Patient Name:   Joseph Gentry, Joseph Gentry Date of Birth:   1951/04/09 Identification Number:   MRN - [id number] Date of Service:   04/18/2024 18:32:33  Diagnosis:       W19.CHERENE - Fall       R53.1 - Weakness       R47.1 - Dysarthria and anarthria  Impression:      9M with a history of alcohol  disuse, GERD who presents after a fall with head trauma and loss of consciousness found to be dysarthric, encephalopathic with bilateral leg weakness (R>L), BUE ataxia, and bilateral vision loss of uncertain acuity. LKN is unable to be confirmed and with his recent head trauma resulting in loss of consciousness he is not a good candidate for thrombolytics. CTA head and neck/CTP ordered to rule out dissection and LVO, results are pending.    If no LVO on vessel imaging recommend PR aspirin 300 mg or PO 325 mg aspirin (if passes swallow eval) and admission for further stroke work up. Recommend Wernicke dose thiamine 500 mg IV TID x 3d, 250 mg daily x5d, then 100 mg daily.  Our recommendations are outlined below.  Recommendations:        Stroke/Telemetry Floor       Neuro Checks (Q4)       Bedside Swallow Eval       DVT Prophylaxis       IV Fluids, Normal Saline       Head of Bed 30 Degrees       Euglycemia and Avoid Hyperthermia (PRN Acetaminophen )       Initiate or continue Aspirin 325 MG daily       Aspirin per rectum       Antihypertensives PRN if Blood pressure is greater than 220/120 or there is a concern for End organ damage/contraindications for permissive HTN. If blood pressure is greater than 220/120 give labetalol PO or IV or Vasotec IV with a goal of 15% reduction in BP during the first 24 hours.  Sign Out:       Discussed with Emergency Department Provider    ------------------------------------------------------------------------------  Advanced Imaging:  CTA Head and Neck Completed.  CTP  Completed.  LVO:No   Metrics: Last Known Well: Unknown Dispatch Time: 04/18/2024 18:32:33 Arrival Time: 04/18/2024 18:21:00 Initial Response Time: 04/18/2024 18:33:34 Symptoms: Fall, Confusion, Slurred speech. Initial patient interaction: 04/18/2024 18:40:11 NIHSS Assessment Completed: 04/18/2024 18:45:45 Patient is not a candidate for Thrombolytic. Thrombolytic Medical Decision: 04/18/2024 18:48:07 Patient was not deemed candidate for Thrombolytic because of following reasons: Significant head trauma or stroke in previous 3 months . Care-team unable to determine eligibility. .  CT Head: I personally reviewed all the CT images that were available to me and it showed: no acute intracranial abnormality, no hemorrhage. Severe chronic microvascular ischemic disease.  Primary Provider Notified of Diagnostic Impression and Management Plan on: 04/18/2024 19:14:17    ------------------------------------------------------------------------------  History of Present Illness: Patient is a 73 year old Male.  Patient was brought by EMS for symptoms of Fall, Confusion, Slurred speech. 9M who presents to the ED after being found down on the ground unresponsive with head trauma and abrasion to the right front side of his head. Patient was initially reported as normal 25 min prior to arrival, but this cannot be substantiated. I reached out to the patient's emergency contacts but was unable to reach anyone.  He is unable to tell me what happened or when he last felt normal.  He cannot elaborate on the timing of his vision changes or right leg weakness. He has a history of heavy alcohol  disuses.  He is in a C collar.    Past Medical History:      There is no history of Stroke  Medications:  No Anticoagulant use  No Antiplatelet use Reviewed EMR for current medications  Allergies:  Reviewed,NKDA  Social History: Alcohol  Use: Yes  Family History:  Family History Cannot Be Obtained  Because:Patient Is Confused  ROS : ROS Cannot Be Obtained Because:  Patient Is Confused  Past Surgical History: Past Surgical History Cannot Be Obtained Because: Patient Is Confused There Is No Surgical History Contributory To Today's Visit     Examination: BP(120/81), Pulse(70), Blood Glucose(126) 1A: Level of Consciousness - Alert; keenly responsive + 0 1B: Ask Month and Age - 1 Question Right + 1 1C: Blink Eyes & Squeeze Hands - Performs 1 Task + 1 2: Test Horizontal Extraocular Movements - Normal + 0 3: Test Visual Fields - Bilateral Hemianopia + 3 4: Test Facial Palsy (Use Grimace if Obtunded) - Normal symmetry + 0 5A: Test Left Arm Motor Drift - No Drift for 10 Seconds + 0 5B: Test Right Arm Motor Drift - No Drift for 10 Seconds + 0 6A: Test Left Leg Motor Drift - Drift, but doesn't hit bed + 1 6B: Test Right Leg Motor Drift - No Effort Against Gravity + 3 7: Test Limb Ataxia (FNF/Heel-Shin) - Ataxia in 2 Limbs + 2 8: Test Sensation - Normal; No sensory loss + 0 9: Test Language/Aphasia - Normal; No aphasia + 0 10: Test Dysarthria - Mild-Moderate Dysarthria: Slurring but can be understood + 1 11: Test Extinction/Inattention - No abnormality + 0  NIHSS Score: 12  NIHSS Free Text : BUE ataxia. 2mm nonreactive, no blink to threat, cannot track or finger count in any quadrant. Cannot answer orientation questions appropriately. Initially not oriented to place, then able to recall after he is told he is in the hospital. He has mild to moderate dysarthria. He does not know the last time he was normal or felt normal. Face is symmetric. No drift BUE. Does not lift RLE antigravity, LLE drift. No sensory deficit.  Pre-Morbid Modified Rankin Scale: Unable to assess  Spoke with : Dr. Melvenia I reviewed the available imaging via Rapid and initiated discussion with the primary provider  This consult was conducted in real time using interactive audio and video technology. Patient was  informed of the technology being used for this visit and agreed to proceed. Patient located in hospital and provider located at home/office setting.   Patient is being evaluated for possible acute neurologic impairment and high probability of imminent or life-threatening deterioration. I spent total of 45 minutes providing care to this patient, including time for face to face visit via telemedicine, review of medical records, imaging studies and discussion of findings with providers, the patient and/or family.    Dr Geni Maroon   [facility] For Inpatient follow-up with [facility] physician please call [facility] at [phone]. As we are not an outpatient service for any post hospital discharge needs please contact the hospital for assistance. If you have any questions for the [facility] physicians or need to reconsult for clinical or diagnostic changes please contact us  via [facility] at [phone].  Non-radiologist review of imaging performed to assist with emergent clinical decision-making. Remote physician workstations do not possess the same resolution, calibration, or diagnostic capabilities as hospital-based radiology reading stations, and  formal radiologist read is necessary.   Signature : Geni Maroon

## 2024-04-19 ENCOUNTER — Other Ambulatory Visit (HOSPITAL_COMMUNITY): Payer: Self-pay | Admitting: *Deleted

## 2024-04-19 ENCOUNTER — Observation Stay (HOSPITAL_COMMUNITY)

## 2024-04-19 DIAGNOSIS — G8929 Other chronic pain: Secondary | ICD-10-CM | POA: Diagnosis present

## 2024-04-19 DIAGNOSIS — S0083XA Contusion of other part of head, initial encounter: Secondary | ICD-10-CM | POA: Diagnosis present

## 2024-04-19 DIAGNOSIS — Z720 Tobacco use: Secondary | ICD-10-CM | POA: Diagnosis not present

## 2024-04-19 DIAGNOSIS — R471 Dysarthria and anarthria: Secondary | ICD-10-CM | POA: Diagnosis present

## 2024-04-19 DIAGNOSIS — Z9981 Dependence on supplemental oxygen: Secondary | ICD-10-CM | POA: Diagnosis not present

## 2024-04-19 DIAGNOSIS — W19XXXA Unspecified fall, initial encounter: Secondary | ICD-10-CM | POA: Diagnosis present

## 2024-04-19 DIAGNOSIS — F1010 Alcohol abuse, uncomplicated: Secondary | ICD-10-CM | POA: Diagnosis present

## 2024-04-19 DIAGNOSIS — R296 Repeated falls: Secondary | ICD-10-CM | POA: Diagnosis present

## 2024-04-19 DIAGNOSIS — Z8546 Personal history of malignant neoplasm of prostate: Secondary | ICD-10-CM | POA: Diagnosis not present

## 2024-04-19 DIAGNOSIS — Y908 Blood alcohol level of 240 mg/100 ml or more: Secondary | ICD-10-CM | POA: Diagnosis present

## 2024-04-19 DIAGNOSIS — J449 Chronic obstructive pulmonary disease, unspecified: Secondary | ICD-10-CM | POA: Diagnosis present

## 2024-04-19 DIAGNOSIS — N400 Enlarged prostate without lower urinary tract symptoms: Secondary | ICD-10-CM | POA: Diagnosis present

## 2024-04-19 DIAGNOSIS — J9611 Chronic respiratory failure with hypoxia: Secondary | ICD-10-CM | POA: Diagnosis present

## 2024-04-19 DIAGNOSIS — Z23 Encounter for immunization: Secondary | ICD-10-CM | POA: Diagnosis present

## 2024-04-19 DIAGNOSIS — K219 Gastro-esophageal reflux disease without esophagitis: Secondary | ICD-10-CM | POA: Diagnosis present

## 2024-04-19 DIAGNOSIS — F32A Depression, unspecified: Secondary | ICD-10-CM | POA: Diagnosis present

## 2024-04-19 DIAGNOSIS — M48061 Spinal stenosis, lumbar region without neurogenic claudication: Secondary | ICD-10-CM | POA: Diagnosis present

## 2024-04-19 DIAGNOSIS — M5416 Radiculopathy, lumbar region: Secondary | ICD-10-CM | POA: Diagnosis present

## 2024-04-19 DIAGNOSIS — Z7982 Long term (current) use of aspirin: Secondary | ICD-10-CM | POA: Diagnosis not present

## 2024-04-19 DIAGNOSIS — I10 Essential (primary) hypertension: Secondary | ICD-10-CM | POA: Diagnosis present

## 2024-04-19 DIAGNOSIS — M2578 Osteophyte, vertebrae: Secondary | ICD-10-CM | POA: Diagnosis present

## 2024-04-19 DIAGNOSIS — M4856XA Collapsed vertebra, not elsewhere classified, lumbar region, initial encounter for fracture: Secondary | ICD-10-CM | POA: Diagnosis present

## 2024-04-19 DIAGNOSIS — G459 Transient cerebral ischemic attack, unspecified: Secondary | ICD-10-CM | POA: Diagnosis present

## 2024-04-19 LAB — CBC
HCT: 45.4 % (ref 39.0–52.0)
Hemoglobin: 15.7 g/dL (ref 13.0–17.0)
MCH: 34.5 pg — ABNORMAL HIGH (ref 26.0–34.0)
MCHC: 34.6 g/dL (ref 30.0–36.0)
MCV: 99.8 fL (ref 80.0–100.0)
Platelets: 295 K/uL (ref 150–400)
RBC: 4.55 MIL/uL (ref 4.22–5.81)
RDW: 14.3 % (ref 11.5–15.5)
WBC: 10.1 K/uL (ref 4.0–10.5)
nRBC: 0 % (ref 0.0–0.2)

## 2024-04-19 LAB — COMPREHENSIVE METABOLIC PANEL WITH GFR
ALT: 10 U/L (ref 0–44)
AST: 20 U/L (ref 15–41)
Albumin: 4 g/dL (ref 3.5–5.0)
Alkaline Phosphatase: 104 U/L (ref 38–126)
Anion gap: 16 — ABNORMAL HIGH (ref 5–15)
BUN: <5 mg/dL — ABNORMAL LOW (ref 8–23)
CO2: 21 mmol/L — ABNORMAL LOW (ref 22–32)
Calcium: 8.8 mg/dL — ABNORMAL LOW (ref 8.9–10.3)
Chloride: 102 mmol/L (ref 98–111)
Creatinine, Ser: 0.62 mg/dL (ref 0.61–1.24)
GFR, Estimated: >60 mL/min (ref >60)
Glucose, Bld: 98 mg/dL (ref 70–99)
Potassium: 4 mmol/L (ref 3.5–5.1)
Sodium: 139 mmol/L (ref 135–145)
Total Bilirubin: 0.3 mg/dL (ref 0.0–1.2)
Total Protein: 6.7 g/dL (ref 6.5–8.1)

## 2024-04-19 LAB — LIPID PANEL
Cholesterol: 176 mg/dL (ref 0–200)
HDL: 46 mg/dL (ref >40)
LDL Cholesterol: 94 mg/dL (ref 0–99)
Total CHOL/HDL Ratio: 3.9 ratio
Triglycerides: 180 mg/dL — ABNORMAL HIGH (ref <150)
VLDL: 36 mg/dL (ref 0–40)

## 2024-04-19 LAB — FOLATE: Folate: 6.5 ng/mL (ref >5.9)

## 2024-04-19 LAB — MAGNESIUM: Magnesium: 2.3 mg/dL (ref 1.7–2.4)

## 2024-04-19 LAB — PHOSPHORUS: Phosphorus: 3 mg/dL (ref 2.5–4.6)

## 2024-04-19 LAB — HEMOGLOBIN A1C
Hgb A1c MFr Bld: 5.6 % (ref 4.8–5.6)
Mean Plasma Glucose: 114.02 mg/dL

## 2024-04-19 LAB — VITAMIN B12: Vitamin B-12: <150 pg/mL — ABNORMAL LOW (ref 180–914)

## 2024-04-19 MED ORDER — THIAMINE HCL 100 MG/ML IJ SOLN
500.0000 mg | INTRAVENOUS | Status: DC
  Administered 2024-04-20: 500 mg via INTRAVENOUS
  Filled 2024-04-19 (×2): qty 5

## 2024-04-19 MED ORDER — THIAMINE MONONITRATE 100 MG PO TABS: 100.0000 mg | ORAL_TABLET | Freq: Every day | ORAL | Status: DC

## 2024-04-19 MED ORDER — HYDRALAZINE HCL 20 MG/ML IJ SOLN: 10.0000 mg | Freq: Four times a day (QID) | INTRAMUSCULAR | Status: DC | PRN

## 2024-04-19 NOTE — Progress Notes (Addendum)
 PROGRESS NOTE    Joseph Gentry  FMW:[id number] DOB: 07/08/1950 DOA: 04/18/2024 PCP: Carlette Benita Area, MD   Brief Narrative:    73 y.o. male with medical history significant of COPD, GERD, alcohol  abuse, BPH, tobacco abuse who presents emergency department via EMS due to a fall and loss of consciousness.  Has normal CT as well as CT angio head and neck.  Echo and MRI of the brain are pending.  Neurology consulted.  Pending PT/OT evaluation.  He lives in an apartment and has an engineer, production.  Assessment & Plan:  Principal Problem:   Dysarthria   Dysarthria, POA: Resolved now.  CT of the head as well as CT angio of the head and neck are within normal limits.  Follow-up MRI of the brain- No acute ischemic stroke. Follow-up PT/OT evaluation as well as speech therapy evaluation Pending echocardiogram Aspirin 325 mg given in the emergency room, continue with aspirin 81 mg daily. Continue with thiamine as per teleneurologist recommendation Fall precautions Consulted neurology  Physical deconditioning/fall at home, POA: Consulted PT/OT right forehead  Right forehead contusion secondary to fall, POA: Fall precautions  COPD, POA: Not in exacerbation.  Continue with inhalation bronchodilator therapy  GERD,POA: Continue with Protonix   BPH, POA: Continue with Flomax   Hypertension, POA: Continue as needed Hydralazine 10 mg IV Q6H for systolic blood pressure greater than 160.  DVT prophylaxis: SCDs Start: 04/18/24 2149     Code Status: Full Code Family Communication: None at the bedside Status is: Observation The patient remains OBS appropriate and will d/c before 2 midnights.    Subjective:  Denies any active complaints.  He said that he does not remember what happened yesterday.  He lives at home with an apartment and walks with the help of a cane.  He told me that he does have an aide who comes almost every day.  Spoke about pending echocardiogram and MRI of the brain.  We also spoke  about the normal results on his CT as well as CT angio of the head and neck.  Examination:  General exam: Appears calm and comfortable  Respiratory system: Clear to auscultation. Respiratory effort normal. Cardiovascular system: S1 & S2 heard, RRR. No JVD, murmurs, rubs, gallops or clicks. No pedal edema. Gastrointestinal system: Abdomen is nondistended, soft and nontender. No organomegaly or masses felt. Normal bowel sounds heard. Central nervous system: Alert and oriented. No focal neurological deficits. Extremities: Symmetric 5 x 5 power. Skin: No rashes, lesions or ulcers     Diet Orders (From admission, onward)     Start     Ordered   04/18/24 2151  Diet Heart Room service appropriate? Yes; Fluid consistency: Thin  Diet effective now       Question Answer Comment  Room service appropriate? Yes   Fluid consistency: Thin      04/18/24 2150            Objective: Vitals:   04/19/24 0143 04/19/24 0549 04/19/24 0711 04/19/24 0752  BP: 122/83 120/77  119/76  Pulse: 81 94  98  Resp: 18 18  17   Temp: (!) 97.4 F (36.3 C) 98.3 F (36.8 C)  97.7 F (36.5 C)  TempSrc: Oral   Oral  SpO2: 90% 90% 91% 90%  Weight:      Height:        Intake/Output Summary (Last 24 hours) at 04/19/2024 0944 Last data filed at 04/19/2024 0602 Gross per 24 hour  Intake 55.73 ml  Output 375 ml  Net -319.27 ml   Filed Weights   04/18/24 1825  Weight: 69.6 kg    Scheduled Meds:  budesonide-glycopyrrolate-formoterol  2 puff Inhalation BID   pantoprazole   40 mg Oral Daily   tamsulosin   0.4 mg Oral BID   [START ON 04/24/2024] thiamine  100 mg Oral Daily   Continuous Infusions:  thiamine (VITAMIN B1) injection Stopped (04/18/24 2336)   [START ON 04/20/2024] thiamine (VITAMIN B1) injection      Nutritional status     Body mass index is 23.33 kg/m.  Data Reviewed:   CBC: Recent Labs  Lab 04/18/24 1830 04/18/24 1831 04/19/24 0442  WBC 8.6  --  10.1  NEUTROABS 4.7  --   --    HGB 15.3 16.3 15.7  HCT 44.2 48.0 45.4  MCV 100.5*  --  99.8  PLT 286  --  295   Basic Metabolic Panel: Recent Labs  Lab 04/18/24 1831 04/18/24 1939 04/19/24 0442  NA 137 134* 139  K 3.9 3.4* 4.0  CL 99 98 102  CO2  --  23 21*  GLUCOSE 128* 97 98  BUN <3* <5* <5*  CREATININE 1.00 0.69 0.62  CALCIUM  --  8.7* 8.8*  MG  --  1.7 2.3  PHOS  --   --  3.0   GFR: Estimated Creatinine Clearance: 77.2 mL/min (by C-G formula based on SCr of 0.62 mg/dL). Liver Function Tests: Recent Labs  Lab 04/18/24 1939 04/19/24 0442  AST 19 20  ALT 10 10  ALKPHOS 96 104  BILITOT 0.3 0.3  PROT 6.5 6.7  ALBUMIN 4.0 4.0   No results for input(s): LIPASE, AMYLASE in the last 168 hours. No results for input(s): AMMONIA in the last 168 hours. Coagulation Profile: Recent Labs  Lab 04/18/24 1830  INR 0.9   Cardiac Enzymes: No results for input(s): CKTOTAL, CKMB, CKMBINDEX, TROPONINI in the last 168 hours. BNP (last 3 results) No results for input(s): PROBNP in the last 8760 hours. HbA1C: Recent Labs    04/18/24 1830  HGBA1C 5.6   CBG: Recent Labs  Lab 04/18/24 1828  GLUCAP 126*   Lipid Profile: Recent Labs    04/19/24 0442  CHOL 176  HDL 46  LDLCALC 94  TRIG 180*  CHOLHDL 3.9   Thyroid Function Tests: No results for input(s): TSH, T4TOTAL, FREET4, T3FREE, THYROIDAB in the last 72 hours. Anemia Panel: Recent Labs    04/19/24 0442  VITAMINB12 <150*  FOLATE 6.5   Sepsis Labs: No results for input(s): PROCALCITON, LATICACIDVEN in the last 168 hours.  No results found for this or any previous visit (from the past 240 hours).       Radiology Studies: CT ANGIO HEAD NECK W WO CM W PERF (CODE STROKE) Result Date: 04/18/2024 EXAM: CT ANGIOGRAPHY OF THE HEAD AND NECK CT PERFUSION BRAIN 04/18/2024 07:16:13 PM TECHNIQUE: Multidetector CT imaging of the head and neck was performed using the standard protocol during bolus administration of  intravenous contrast. 3D postprocessing with multiplanar reconstructions and MIPs was performed to evaluate the vascular anatomy. Carotid stenosis measurements (when applicable) are obtained utilizing NASCET criteria, using the distal internal carotid diameter as the denominator. Cerebral perfusion analysis using computed tomography with contrast administration, including post-processing of parametric maps with determination of cerebral blood flow, cerebral blood volume, mean transit time and time-to-maximum. RADIATION DOSE REDUCTION: This exam was performed according to the departmental dose-optimization program which includes automated exposure control, adjustment of the mA and/or kV according to patient  size and/or use of iterative reconstruction technique. CONTRAST: Without and with IV contrast, 100mL (iohexol (OMNIPAQUE) 350 MG/ML injection 100 mL IOHEXOL 350 MG/ML SOLN) COMPARISON: Same day CT head. CLINICAL HISTORY: Neuro deficit, acute, stroke suspected FINDINGS: AORTIC ARCH AND ARCH VESSELS: No dissection or arterial injury. No significant stenosis of the brachiocephalic or subclavian arteries. CERVICAL CAROTID ARTERIES: No dissection, arterial injury, or hemodynamically significant stenosis by NASCET criteria. CERVICAL VERTEBRAL ARTERIES: No dissection, arterial injury, or significant stenosis. LUNGS AND MEDIASTINUM: Unremarkable. SOFT TISSUES: No acute abnormality. BONES: No acute abnormality. ANTERIOR CIRCULATION: No significant stenosis of the internal carotid arteries. No significant stenosis of the anterior cerebral arteries. No significant stenosis of the middle cerebral arteries. No aneurysm. POSTERIOR CIRCULATION: No significant stenosis of the posterior cerebral arteries. No significant stenosis of the basilar artery. No significant stenosis of the vertebral arteries. No aneurysm. OTHER: No dural venous sinus thrombosis on this non-dedicated study. EXAM QUALITY: Exam quality is adequate with  diagnostic perfusion maps. No significant motion artifact. Appropriate arterial inflow and venous outflow curves. CORE INFARCT (CBF<30% volume): 0 mL TOTAL HYPOPERFUSION (Tmax>6s volume): 9 mL Mismatch volume: 9 mL Mismatch ratio: infinite Location: Cerebellum IMPRESSION: 1. No acute large vessel occlusion. 2. No hemodynamically significant stenosis. 3. On CT perfusion, 9 mL of reported penumbra in the cerebellum which is indeterminate but favored artifactual. MRI could better evaluate for acute infarct if clinically warranted. Electronically signed by: Gilmore Molt MD 04/18/2024 07:50 PM EST RP Workstation: [id number]   CT Cervical Spine Wo Contrast Result Date: 04/18/2024 EXAM: CT CERVICAL SPINE WITHOUT CONTRAST 04/18/2024 06:43:38 PM TECHNIQUE: CT of the cervical spine was performed without the administration of intravenous contrast. Multiplanar reformatted images are provided for review. Automated exposure control, iterative reconstruction, and/or weight based adjustment of the mA/kV was utilized to reduce the radiation dose to as low as reasonably achievable. COMPARISON: None available. CLINICAL HISTORY: Neck trauma (Age >= 65y). FINDINGS: CERVICAL SPINE: BONES AND ALIGNMENT: Straightening/mild reversal of the normal cervical lordosis. Grade 1 anterolisthesis of C3 on C4, C4 on C5, and C7 on T1. No acute fracture or suspicious lesion. DEGENERATIVE CHANGES: Diffuse cervical disc degeneration, with disc space narrowing and degenerative endplate changes being greatest at C5-C6 and C6-C7. Asymmetrically advanced right facet arthrosis at C2-C3 and C3-C4 and left facet arthrosis at C4-C5 with resultant neural foraminal stenosis at these levels. No evidence of high grade spinal canal stenosis. SOFT TISSUES: No prevertebral soft tissue swelling. Emphysema in the included lung apices. IMPRESSION: 1. No acute cervical spine fracture. 2. Advanced cervical disc and facet degeneration. 3. Emphysema; consider  evaluation for eligibility for low-dose CT lung cancer screening program. Electronically signed by: Dasie Hamburg MD 04/18/2024 06:59 PM EST RP Workstation: [id number]   CT HEAD CODE STROKE WO CONTRAST (LKW 0-4.5h, LVO 0-24h) Result Date: 04/18/2024 EXAM: CT HEAD WITHOUT 04/18/2024 06:43:38 PM TECHNIQUE: CT of the head was performed without the administration of intravenous contrast. Automated exposure control, iterative reconstruction, and/or weight based adjustment of the mA/kV was utilized to reduce the radiation dose to as low as reasonably achievable. COMPARISON: None available. CLINICAL HISTORY: Neuro deficit, acute, stroke suspected. Fall and slurred speech. FINDINGS: BRAIN AND VENTRICLES: There is no evidence of an acute infarct, intracranial hemorrhage, mass, midline shift, hydrocephalus, or extra-axial fluid collection. Confluent hypodensities in the cerebral white matter bilaterally are nonspecific but compatible with severe chronic small vessel ischemic disease. There is mild to moderate central predominant cerebral atrophy. Calcified atherosclerosis at the skull base. ORBITS: No acute  abnormality. SINUSES AND MASTOIDS: Partially visualized chronic deformities of the posterior walls of the left greater than right maxillary sinuses. Mild bilateral ethmoid sinus mucosal thickening. Clear mastoid air cells. SOFT TISSUES AND SKULL: No acute skull fracture. Mild right frontal scalp soft tissue swelling. [place] Stroke Program Early CT Score (ASPECTS) ----- Ganglionic (caudate, IC, lentiform nucleus, insula, M1-M3): 7 Supraganglionic (M4-M6): 3 Total: 10 Findings were communicated by telephone to Dr. Bernardino Fireman on 04/18/2024 at 6:49 pm. IMPRESSION: 1. No acute intracranial abnormality. ASPECTS of 10. 2. Severe chronic small vessel ischemic disease. Electronically signed by: Dasie Hamburg MD 04/18/2024 06:52 PM EST RP Workstation: [id number]           LOS: 0 days   Time spent= 35 mins    Deliliah Room, MD [facility]  If 7PM-7AM, please contact night-coverage  04/19/2024, 9:44 AM

## 2024-04-19 NOTE — Progress Notes (Signed)
 Patient up to restroom throughout the night ambulated with SBA.  No complaints offered, pleasant and cooperative.

## 2024-04-19 NOTE — Progress Notes (Signed)
 04/19/2024 12:04 PM -----------------------------------------------------------[facility]--------------------------------------------------- D(Data) A(Action) R(response)     Data: Noted from [facility] Handoff notes, potential need for home o2.     Action: Sent EPIC Secure Chat to primary RN.     Response: RN states pt. Just worked with PT but if patient ambulates later will obtain o2 qualifying note.     Opal Dinning, RN [employer]

## 2024-04-19 NOTE — Plan of Care (Signed)

## 2024-04-19 NOTE — Evaluation (Signed)
 Physical Therapy Evaluation Patient Details Name: Joseph Gentry MRN: [id number] DOB: 03/05/1951 Today's Date: 04/19/2024  History of Present Illness  Joseph Gentry is a 73 y.o. male with medical history significant of COPD, GERD, alcohol  abuse, BPH, tobacco abuse who presents emergency department via EMS due to a fall and loss of consciousness.  Patient was unable to provide history, history was obtained from EDP and ED medical record.  Per report, patient was seen in his normal state about 25 minutes PTA.  He had an unwitnessed fall close to his apartment whereby he was minimally responsive.  EMS was activated and on arrival of EMS team, he was noted to present with slowed speech and decreased responsiveness.  CBG checked was 112.   Clinical Impression  Patient functioning at baseline for functional mobility and gait demonstrating good return for ambulating in room, hallway using his cane without loss of balance. Plan:  Patient discharged from physical therapy to care of nursing for ambulation daily as tolerated for length of stay.          If plan is discharge home, recommend the following: Assist for transportation   Can travel by private vehicle        Equipment Recommendations None recommended by PT  Recommendations for Other Services       Functional Status Assessment Patient has not had a recent decline in their functional status     Precautions / Restrictions Precautions Precautions: None Recall of Precautions/Restrictions: Intact Restrictions Weight Bearing Restrictions Per Provider Order: No      Mobility  Bed Mobility Overal bed mobility: Independent                  Transfers Overall transfer level: Modified independent                      Ambulation/Gait Ambulation/Gait assistance: Modified independent (Device/Increase time) Gait Distance (Feet): 150 Feet Assistive device: Straight cane Gait Pattern/deviations: Step-through pattern,  WFL(Within Functional Limits) Gait velocity: near normal     General Gait Details: grossly WFL with mostly step-through pattern using his cane without loss of balance  Stairs            Wheelchair Mobility     Tilt Bed    Modified Rankin (Stroke Patients Only)       Balance Overall balance assessment: Modified Independent                                           Pertinent Vitals/Pain Pain Assessment Pain Assessment: No/denies pain    Home Living Family/patient expects to be discharged to:: Private residence Living Arrangements: Alone Available Help at Discharge: Family;Available PRN/intermittently Type of Home: Apartment Home Access: Elevator       Home Layout: One level Home Equipment: Cane - single point;Grab bars - tub/shower      Prior Function Prior Level of Function : Independent/Modified Independent             Mobility Comments: Community ambulation using SPC, shops, does not drive ADLs Comments: Independent for household, assisted with community ADLs     Extremity/Trunk Assessment   Upper Extremity Assessment Upper Extremity Assessment: Defer to OT evaluation    Lower Extremity Assessment Lower Extremity Assessment: Overall WFL for tasks assessed    Cervical / Trunk Assessment Cervical / Trunk Assessment: Normal  Communication  Communication Communication: No apparent difficulties    Cognition Arousal: Alert Behavior During Therapy: WFL for tasks assessed/performed   PT - Cognitive impairments: No apparent impairments                         Following commands: Intact       Cueing Cueing Techniques: Verbal cues     General Comments      Exercises     Assessment/Plan    PT Assessment Patient does not need any further PT services  PT Problem List         PT Treatment Interventions      PT Goals (Current goals can be found in the Care Plan section)  Acute Rehab PT Goals Patient  Stated Goal: return home with family to assist PT Goal Formulation: With patient Time For Goal Achievement: 04/19/24 Potential to Achieve Goals: Good    Frequency       Co-evaluation               AM-PAC PT 6 Clicks Mobility  Outcome Measure Help needed turning from your back to your side while in a flat bed without using bedrails?: None Help needed moving from lying on your back to sitting on the side of a flat bed without using bedrails?: None Help needed moving to and from a bed to a chair (including a wheelchair)?: None Help needed standing up from a chair using your arms (e.g., wheelchair or bedside chair)?: None Help needed to walk in hospital room?: None Help needed climbing 3-5 steps with a railing? : None 6 Click Score: 24    End of Session   Activity Tolerance: Patient tolerated treatment well Patient left: in bed;with call bell/phone within reach Nurse Communication: Mobility status PT Visit Diagnosis: Unsteadiness on feet (R26.81);Other abnormalities of gait and mobility (R26.89);Muscle weakness (generalized) (M62.81)    Time: 9147-9094 PT Time Calculation (min) (ACUTE ONLY): 13 min   Charges:   PT Evaluation $PT Eval Low Complexity: 1 Low PT Treatments $Therapeutic Activity: 8-22 mins PT General Charges $$ ACUTE PT VISIT: 1 Visit         12:21 PM, 04/19/24 Lynwood Music, MPT Physical Therapist with [facility] 336 [phone] office [phone] mobile phone

## 2024-04-19 NOTE — Progress Notes (Signed)
  Transition of Care [facility]) Screening Note   Patient Details  Name: Joseph Gentry Date of Birth: Nov 30, 1950   Transition of Care [facility]) CM/SW Contact:    Hoy DELENA Bigness, LCSW Phone Number: 04/19/2024, 10:02 AM    Transition of Care Department [facility]) has reviewed patient and no TOC needs have been identified at this time. We will continue to monitor patient advancement through interdisciplinary progression rounds. If new patient transition needs arise, please place a TOC consult.    04/19/24 0958  TOC Brief Assessment  Insurance and Status Reviewed  Patient has primary care physician Yes  Home environment has been reviewed Apartment  Prior level of function: Modified independent w/ cane  Prior/Current Home Services Current home services (Daily aide)  Social Drivers of Health Review SDOH reviewed no interventions necessary  Readmission risk has been reviewed Yes  Transition of care needs no transition of care needs at this time

## 2024-04-19 NOTE — Progress Notes (Signed)
 SLP Cancellation Note  Patient Details Name: Joseph Gentry MRN: [id number] DOB: [birth year]   Cancelled treatment:       Reason Eval/Treat Not Completed: Patient at procedure or test/unavailable; pt with therapy when SLP attempted.  Will continue efforts as schedule permits.   Pat Lakeena Downie,M.S.,CCC-SLP 04/19/2024, 10:35 AM

## 2024-04-19 NOTE — Evaluation (Signed)
 Occupational Therapy Evaluation Patient Details Name: Joseph Gentry MRN: [id number] DOB: April 26, 1951 Today's Date: 04/19/2024   History of Present Illness   Joseph Gentry is a 73 y.o. male with medical history significant of COPD, GERD, alcohol  abuse, BPH, tobacco abuse who presents emergency department via EMS due to a fall and loss of consciousness.  Patient was unable to provide history, history was obtained from EDP and ED medical record.  Per report, patient was seen in his normal state about 25 minutes PTA.  He had an unwitnessed fall close to his apartment whereby he was minimally responsive.  EMS was activated and on arrival of EMS team, he was noted to present with slowed speech and decreased responsiveness.  CBG checked was 112.     Clinical Impressions Pt admitted for concerns listed above. PTA pt reported that he was independent with all ADL's and IADL's, including community ambulation. At this time, pt presents at/near his baseline, completing BADL's with no assist. Pt has no further skilled OT needs at this time and will be discharged from skilled OT.      If plan is discharge home, recommend the following:         Functional Status Assessment   Patient has had a recent decline in their functional status and demonstrates the ability to make significant improvements in function in a reasonable and predictable amount of time.     Equipment Recommendations   None recommended by OT     Recommendations for Other Services         Precautions/Restrictions   Precautions Precautions: None Recall of Precautions/Restrictions: Intact Restrictions Weight Bearing Restrictions Per Provider Order: No     Mobility Bed Mobility Overal bed mobility: Independent                  Transfers Overall transfer level: Modified independent Equipment used: Quad cane                      Balance Overall balance assessment: Modified Independent                                          ADL either performed or assessed with clinical judgement   ADL Overall ADL's : Modified independent;At baseline                                       General ADL Comments: Able to complete independently     Vision Baseline Vision/History: 0 No visual deficits Ability to See in Adequate Light: 0 Adequate Patient Visual Report: No change from baseline Vision Assessment?: No apparent visual deficits     Perception         Praxis         Pertinent Vitals/Pain Pain Assessment Pain Assessment: 0-10 Pain Score: 3  Pain Location: chest Pain Descriptors / Indicators: Aching, Sore Pain Intervention(s): Limited activity within patient's tolerance, Monitored during session, Repositioned     Extremity/Trunk Assessment Upper Extremity Assessment Upper Extremity Assessment: Overall WFL for tasks assessed   Lower Extremity Assessment Lower Extremity Assessment: Overall WFL for tasks assessed   Cervical / Trunk Assessment Cervical / Trunk Assessment: Normal   Communication Communication Communication: No apparent difficulties   Cognition Arousal: Alert Behavior During Therapy: WFL for tasks assessed/performed  Cognition: No apparent impairments                               Following commands: Intact       Cueing  General Comments   Cueing Techniques: Verbal cues  VSS on RW   Exercises     Shoulder Instructions      Home Living Family/patient expects to be discharged to:: Private residence Living Arrangements: Alone Available Help at Discharge: Family;Available PRN/intermittently Type of Home: Apartment Home Access: Elevator     Home Layout: One level     Bathroom Shower/Tub: Chief Strategy Officer: Standard Bathroom Accessibility: Yes How Accessible: Accessible via walker Home Equipment: Cane - single point;Grab bars - tub/shower          Prior  Functioning/Environment Prior Level of Function : Independent/Modified Independent             Mobility Comments: Community ambulation using SPC, shops, does not drive ADLs Comments: Independent for household, assisted with community ADLs    OT Problem List: Impaired balance (sitting and/or standing)   OT Treatment/Interventions:        OT Goals(Current goals can be found in the care plan section)   Acute Rehab OT Goals Patient Stated Goal: To go home OT Goal Formulation: With patient Time For Goal Achievement: 04/19/24 Potential to Achieve Goals: Good   OT Frequency:       Co-evaluation              AM-PAC OT 6 Clicks Daily Activity     Outcome Measure Help from another person eating meals?: None Help from another person taking care of personal grooming?: None Help from another person toileting, which includes using toliet, bedpan, or urinal?: None Help from another person bathing (including washing, rinsing, drying)?: None Help from another person to put on and taking off regular upper body clothing?: None Help from another person to put on and taking off regular lower body clothing?: None 6 Click Score: 24   End of Session Nurse Communication: Mobility status  Activity Tolerance: Patient tolerated treatment well Patient left: in bed;with call bell/phone within reach;with family/visitor present  OT Visit Diagnosis: Unsteadiness on feet (R26.81);Other abnormalities of gait and mobility (R26.89);Muscle weakness (generalized) (M62.81)                Time: 8968-8957 OT Time Calculation (min): 11 min Charges:  OT General Charges $OT Visit: 1 Visit OT Evaluation $OT Eval Moderate Complexity: 1 Mod  Yoshiko Keleher, OTR/L [facility]  Laraya Pestka Jillyn Nightingale 04/19/2024, 2:34 PM

## 2024-04-19 NOTE — Progress Notes (Signed)
 Attempted echo x 2. First had to use restroom, later nurse and nurse tech was busy with patient. Will try again in a.m.                                             Aida Pizza, RCS

## 2024-04-20 ENCOUNTER — Inpatient Hospital Stay (HOSPITAL_COMMUNITY)

## 2024-04-20 DIAGNOSIS — G459 Transient cerebral ischemic attack, unspecified: Secondary | ICD-10-CM

## 2024-04-20 DIAGNOSIS — R471 Dysarthria and anarthria: Secondary | ICD-10-CM | POA: Diagnosis not present

## 2024-04-20 LAB — ECHOCARDIOGRAM COMPLETE
AR max vel: 3.18 cm2
AV Area VTI: 3.8 cm2
AV Area mean vel: 3.48 cm2
AV Mean grad: 1 mmHg
AV Peak grad: 3.4 mmHg
Ao pk vel: 0.92 m/s
Area-P 1/2: 3.91 cm2
Calc EF: 75.7 %
Height: 68 in
MV VTI: 2.57 cm2
S' Lateral: 2.6 cm
Single Plane A2C EF: 77.7 %
Single Plane A4C EF: 66.7 %
Weight: 2455.04 [oz_av]

## 2024-04-20 MED ORDER — FOLIC ACID 1 MG PO TABS
1.0000 mg | ORAL_TABLET | Freq: Every day | ORAL | Status: DC
  Administered 2024-04-20 – 2024-04-21 (×2): 1 mg via ORAL
  Filled 2024-04-20 (×2): qty 1

## 2024-04-20 NOTE — Progress Notes (Signed)
 04/20/2024 7:52 AM ----------------------------------------------Patient assessed by the [facility]------------------------------------   Chart reviewed:Yes   Documentation gaps: NONE.   Labs, test, and orders reviewed: Yes  30-day Readmission: Yes  Discharge order: No. EDD listed for today 04/20/2024   Barrier to discharge before 11am: Pt. Still needs 2 D Echo completed as well as potential need for home o2.    Intervention provided by [facility] team: EPIC Secure Chat with primary team to gain O2 qualifying note when able. Confirmed plans for Echo Lab to complete 2D Echo this am.    Barrier resolved: No, not at this time.   Ramonita Koenig, RN [employer]

## 2024-04-20 NOTE — Progress Notes (Signed)
 Patients daughter [name] phoned nurse checking on her father.  Updates given.

## 2024-04-20 NOTE — Progress Notes (Signed)
 SLP Cancellation Note  Patient Details Name: Joseph Gentry MRN: [id number] DOB: 16-Feb-1951   Cancelled treatment:       Reason Eval/Treat Not Completed: Patient at procedure or test/unavailable;pt in MRI when evaluation attempted.  ST will continue efforts.   Pat Myrle Wanek,M.S.,CCC-SLP 04/20/2024, 10:59 AM

## 2024-04-20 NOTE — Plan of Care (Signed)

## 2024-04-20 NOTE — Progress Notes (Addendum)
 PROGRESS NOTE    Joseph Gentry  FMW:[id number] DOB: [birth year] DOA: 04/18/2024 PCP: Carlette Benita Area, MD   Brief Narrative:    73 y.o. male with medical history significant of COPD, GERD, alcohol  abuse, BPH, tobacco abuse who presents emergency department via EMS due to a fall and loss of consciousness.  Has normal CT as well as CT angio head and neck.  Echo and MRI of the brain are unremarkable.  PT/OT evaluations done.  He lives in an apartment and has an engineer, production.  Assessment & Plan:  Principal Problem:   Dysarthria   Dysarthria, POA: Likely TIA, Resolved now.  CT of the head as well as CT angio of the head and neck are within normal limits.  Follow-up MRI of the brain- No acute ischemic stroke. Follow-up PT/OT evaluation as well as speech therapy evaluation Pending echocardiogram Aspirin 325 mg given in the emergency room, continue with aspirin 81 mg daily. Continue with thiamine as per teleneurologist recommendation Fall precautions  Physical deconditioning/fall at home, POA: Consulted PT/OT  Right forehead contusion secondary to fall, POA: Fall precautions  COPD, POA: Not in exacerbation.  Continue with inhalation bronchodilator therapy  Alcohol  abuse,POA: counseled extensively. Case management will provide resources.  Chronic hypoxia respiratory failure, POA: Non-complaint.  GERD,POA: Continue with Protonix   BPH, POA: Continue with Flomax   Hypertension, POA: Continue as needed Hydralazine 10 mg IV Q6H for systolic blood pressure greater than 160.  Disposition: Home, has an aide.  DVT prophylaxis: SCDs Start: 04/18/24 2149     Code Status: Full Code Family Communication: Daughter at the bedside Status pd:Pweu    Subjective:  Spoke to his daughter at the bedside, she is interested to see if his home aide hours can be increased from 2 hours per day. She would also like to see if he is a candidate for HHS. She wants to get MRI right hip since he has chronic  hip pain and multiple falls at home. She told me that she drove from [place] yesterday.  Examination:  General exam: Appears calm and comfortable  Respiratory system: Clear to auscultation. Respiratory effort normal. Cardiovascular system: S1 & S2 heard, RRR. No JVD, murmurs, rubs, gallops or clicks. No pedal edema. Gastrointestinal system: Abdomen is nondistended, soft and nontender. No organomegaly or masses felt. Normal bowel sounds heard. Central nervous system: Alert and oriented. No focal neurological deficits. Extremities: Symmetric 5 x 5 power. Skin: No rashes, lesions or ulcers     Diet Orders (From admission, onward)     Start     Ordered   04/18/24 2151  Diet Heart Room service appropriate? Yes; Fluid consistency: Thin  Diet effective now       Question Answer Comment  Room service appropriate? Yes   Fluid consistency: Thin      04/18/24 2150            Objective: Vitals:   04/19/24 1947 04/19/24 2001 04/20/24 0446 04/20/24 0846  BP:   111/73   Pulse:   72   Resp:   18   Temp:   97.8 F (36.6 C)   TempSrc:   Oral   SpO2: 92% 91% 92% 90%  Weight:      Height:        Intake/Output Summary (Last 24 hours) at 04/20/2024 1000 Last data filed at 04/20/2024 0336 Gross per 24 hour  Intake 164.27 ml  Output --  Net 164.27 ml   Filed Weights   04/18/24 1825  Weight:  69.6 kg    Scheduled Meds:  budesonide-glycopyrrolate-formoterol  2 puff Inhalation BID   pantoprazole   40 mg Oral Daily   tamsulosin   0.4 mg Oral BID   [START ON 04/24/2024] thiamine  100 mg Oral Daily   Continuous Infusions:  thiamine (VITAMIN B1) injection 500 mg (04/20/24 0847)   thiamine (VITAMIN B1) injection      Nutritional status     Body mass index is 23.33 kg/m.  Data Reviewed:   CBC: Recent Labs  Lab 04/18/24 1830 04/18/24 1831 04/19/24 0442  WBC 8.6  --  10.1  NEUTROABS 4.7  --   --   HGB 15.3 16.3 15.7  HCT 44.2 48.0 45.4  MCV 100.5*  --  99.8  PLT 286   --  295   Basic Metabolic Panel: Recent Labs  Lab 04/18/24 1831 04/18/24 1939 04/19/24 0442  NA 137 134* 139  K 3.9 3.4* 4.0  CL 99 98 102  CO2  --  23 21*  GLUCOSE 128* 97 98  BUN <3* <5* <5*  CREATININE 1.00 0.69 0.62  CALCIUM  --  8.7* 8.8*  MG  --  1.7 2.3  PHOS  --   --  3.0   GFR: Estimated Creatinine Clearance: 77.2 mL/min (by C-G formula based on SCr of 0.62 mg/dL). Liver Function Tests: Recent Labs  Lab 04/18/24 1939 04/19/24 0442  AST 19 20  ALT 10 10  ALKPHOS 96 104  BILITOT 0.3 0.3  PROT 6.5 6.7  ALBUMIN 4.0 4.0   No results for input(s): LIPASE, AMYLASE in the last 168 hours. No results for input(s): AMMONIA in the last 168 hours. Coagulation Profile: Recent Labs  Lab 04/18/24 1830  INR 0.9   Cardiac Enzymes: No results for input(s): CKTOTAL, CKMB, CKMBINDEX, TROPONINI in the last 168 hours. BNP (last 3 results) No results for input(s): PROBNP in the last 8760 hours. HbA1C: Recent Labs    04/18/24 1830  HGBA1C 5.6   CBG: Recent Labs  Lab 04/18/24 1828  GLUCAP 126*   Lipid Profile: Recent Labs    04/19/24 0442  CHOL 176  HDL 46  LDLCALC 94  TRIG 180*  CHOLHDL 3.9   Thyroid Function Tests: No results for input(s): TSH, T4TOTAL, FREET4, T3FREE, THYROIDAB in the last 72 hours. Anemia Panel: Recent Labs    04/19/24 0442  VITAMINB12 <150*  FOLATE 6.5   Sepsis Labs: No results for input(s): PROCALCITON, LATICACIDVEN in the last 168 hours.  No results found for this or any previous visit (from the past 240 hours).       Radiology Studies: DG HIP UNILAT WITH PELVIS 2-3 VIEWS LEFT Result Date: 04/19/2024 EXAM: 2 OR MORE VIEW(S) XRAY OF THE LEFT UNILATERAL HIP 04/19/2024 10:20:00 AM COMPARISON: CT pelvis 12/18/2015. CLINICAL HISTORY: Hip pain, bilateral. FINDINGS: BONES AND JOINTS: No acute fracture or focal osseous lesion. Minimal spurring of the left acetabulum. Mild spurring of the left femoral  head. The hip joint is maintained. SOFT TISSUES: The soft tissues are unremarkable. IMPRESSION: 1. No acute osseous abnormality. 2. Minimal spurring of the left acetabulum. 3. Mild spurring of the left femoral head. Electronically signed by: Ryan Salvage MD 04/19/2024 06:27 PM EST RP Workstation: [id number]   DG HIP UNILAT WITH PELVIS 2-3 VIEWS RIGHT Result Date: 04/19/2024 EXAM: 2 or more VIEW(S) XRAY OF THE RIGHT HIP 04/19/2024 10:20:00 AM COMPARISON: CT pelvis 09/12/2018. CLINICAL HISTORY: Hip pain, bilateral. FINDINGS: BONES AND JOINTS: No fracture or acute bony findings. The hip joint  is maintained. No significant degenerative changes. SOFT TISSUES: High density in the urinary bladder compatible with contrast medium. The soft tissues are unremarkable. IMPRESSION: 1. No acute bony findings. Electronically signed by: Ryan Salvage MD 04/19/2024 06:25 PM EST RP Workstation: [id number]   MR BRAIN WO CONTRAST Result Date: 04/19/2024 EXAM: MRI BRAIN WITHOUT CONTRAST 04/19/2024 09:55:05 AM TECHNIQUE: Multiplanar multisequence MRI of the head/brain was performed without the administration of intravenous contrast. COMPARISON: Head CT and CTA 04/18/2024. CLINICAL HISTORY: Neuro deficit, acute, stroke suspected. FINDINGS: BRAIN AND VENTRICLES: There is no evidence of an acute infarct, intracranial hemorrhage, mass, midline shift, hydrocephalus, or extra-axial fluid collection. Confluent T2 hyperintensities in the cerebral white matter bilaterally are nonspecific but compatible with severe chronic small vessel ischemic disease. There is moderate, central predominant cerebral atrophy. Major intracranial vascular flow voids are preserved. The sella is unremarkable. ORBITS: No acute abnormality. SINUSES AND MASTOIDS: No acute abnormality. BONES AND SOFT TISSUES: Normal marrow signal. IMPRESSION: 1. No acute intracranial abnormality. 2. Severe chronic small vessel ischemic disease. Electronically signed by:  Dasie Hamburg MD 04/19/2024 10:04 AM EST RP Workstation: [id number]   CT ANGIO HEAD NECK W WO CM W PERF (CODE STROKE) Result Date: 04/18/2024 EXAM: CT ANGIOGRAPHY OF THE HEAD AND NECK CT PERFUSION BRAIN 04/18/2024 07:16:13 PM TECHNIQUE: Multidetector CT imaging of the head and neck was performed using the standard protocol during bolus administration of intravenous contrast. 3D postprocessing with multiplanar reconstructions and MIPs was performed to evaluate the vascular anatomy. Carotid stenosis measurements (when applicable) are obtained utilizing NASCET criteria, using the distal internal carotid diameter as the denominator. Cerebral perfusion analysis using computed tomography with contrast administration, including post-processing of parametric maps with determination of cerebral blood flow, cerebral blood volume, mean transit time and time-to-maximum. RADIATION DOSE REDUCTION: This exam was performed according to the departmental dose-optimization program which includes automated exposure control, adjustment of the mA and/or kV according to patient size and/or use of iterative reconstruction technique. CONTRAST: Without and with IV contrast, 100mL (iohexol (OMNIPAQUE) 350 MG/ML injection 100 mL IOHEXOL 350 MG/ML SOLN) COMPARISON: Same day CT head. CLINICAL HISTORY: Neuro deficit, acute, stroke suspected FINDINGS: AORTIC ARCH AND ARCH VESSELS: No dissection or arterial injury. No significant stenosis of the brachiocephalic or subclavian arteries. CERVICAL CAROTID ARTERIES: No dissection, arterial injury, or hemodynamically significant stenosis by NASCET criteria. CERVICAL VERTEBRAL ARTERIES: No dissection, arterial injury, or significant stenosis. LUNGS AND MEDIASTINUM: Unremarkable. SOFT TISSUES: No acute abnormality. BONES: No acute abnormality. ANTERIOR CIRCULATION: No significant stenosis of the internal carotid arteries. No significant stenosis of the anterior cerebral arteries. No significant stenosis  of the middle cerebral arteries. No aneurysm. POSTERIOR CIRCULATION: No significant stenosis of the posterior cerebral arteries. No significant stenosis of the basilar artery. No significant stenosis of the vertebral arteries. No aneurysm. OTHER: No dural venous sinus thrombosis on this non-dedicated study. EXAM QUALITY: Exam quality is adequate with diagnostic perfusion maps. No significant motion artifact. Appropriate arterial inflow and venous outflow curves. CORE INFARCT (CBF<30% volume): 0 mL TOTAL HYPOPERFUSION (Tmax>6s volume): 9 mL Mismatch volume: 9 mL Mismatch ratio: infinite Location: Cerebellum IMPRESSION: 1. No acute large vessel occlusion. 2. No hemodynamically significant stenosis. 3. On CT perfusion, 9 mL of reported penumbra in the cerebellum which is indeterminate but favored artifactual. MRI could better evaluate for acute infarct if clinically warranted. Electronically signed by: Gilmore Molt MD 04/18/2024 07:50 PM EST RP Workstation: [id number]   CT Cervical Spine Wo Contrast Result Date: 04/18/2024 EXAM: CT CERVICAL SPINE WITHOUT CONTRAST  04/18/2024 06:43:38 PM TECHNIQUE: CT of the cervical spine was performed without the administration of intravenous contrast. Multiplanar reformatted images are provided for review. Automated exposure control, iterative reconstruction, and/or weight based adjustment of the mA/kV was utilized to reduce the radiation dose to as low as reasonably achievable. COMPARISON: None available. CLINICAL HISTORY: Neck trauma (Age >= 65y). FINDINGS: CERVICAL SPINE: BONES AND ALIGNMENT: Straightening/mild reversal of the normal cervical lordosis. Grade 1 anterolisthesis of C3 on C4, C4 on C5, and C7 on T1. No acute fracture or suspicious lesion. DEGENERATIVE CHANGES: Diffuse cervical disc degeneration, with disc space narrowing and degenerative endplate changes being greatest at C5-C6 and C6-C7. Asymmetrically advanced right facet arthrosis at C2-C3 and C3-C4 and left  facet arthrosis at C4-C5 with resultant neural foraminal stenosis at these levels. No evidence of high grade spinal canal stenosis. SOFT TISSUES: No prevertebral soft tissue swelling. Emphysema in the included lung apices. IMPRESSION: 1. No acute cervical spine fracture. 2. Advanced cervical disc and facet degeneration. 3. Emphysema; consider evaluation for eligibility for low-dose CT lung cancer screening program. Electronically signed by: Dasie Hamburg MD 04/18/2024 06:59 PM EST RP Workstation: [id number]   CT HEAD CODE STROKE WO CONTRAST (LKW 0-4.5h, LVO 0-24h) Result Date: 04/18/2024 EXAM: CT HEAD WITHOUT 04/18/2024 06:43:38 PM TECHNIQUE: CT of the head was performed without the administration of intravenous contrast. Automated exposure control, iterative reconstruction, and/or weight based adjustment of the mA/kV was utilized to reduce the radiation dose to as low as reasonably achievable. COMPARISON: None available. CLINICAL HISTORY: Neuro deficit, acute, stroke suspected. Fall and slurred speech. FINDINGS: BRAIN AND VENTRICLES: There is no evidence of an acute infarct, intracranial hemorrhage, mass, midline shift, hydrocephalus, or extra-axial fluid collection. Confluent hypodensities in the cerebral white matter bilaterally are nonspecific but compatible with severe chronic small vessel ischemic disease. There is mild to moderate central predominant cerebral atrophy. Calcified atherosclerosis at the skull base. ORBITS: No acute abnormality. SINUSES AND MASTOIDS: Partially visualized chronic deformities of the posterior walls of the left greater than right maxillary sinuses. Mild bilateral ethmoid sinus mucosal thickening. Clear mastoid air cells. SOFT TISSUES AND SKULL: No acute skull fracture. Mild right frontal scalp soft tissue swelling. [place] Stroke Program Early CT Score (ASPECTS) ----- Ganglionic (caudate, IC, lentiform nucleus, insula, M1-M3): 7 Supraganglionic (M4-M6): 3 Total: 10 Findings were  communicated by telephone to Dr. Bernardino Fireman on 04/18/2024 at 6:49 pm. IMPRESSION: 1. No acute intracranial abnormality. ASPECTS of 10. 2. Severe chronic small vessel ischemic disease. Electronically signed by: Dasie Hamburg MD 04/18/2024 06:52 PM EST RP Workstation: [id number]           LOS: 1 day   Time spent= 37 mins    Deliliah Room, MD [facility]  If 7PM-7AM, please contact night-coverage  04/20/2024, 10:00 AM

## 2024-04-20 NOTE — Plan of Care (Signed)
   Problem: Education: Goal: Knowledge of General Education information will improve Description Including pain rating scale, medication(s)/side effects and non-pharmacologic comfort measures Outcome: Progressing   Problem: Health Behavior/Discharge Planning: Goal: Ability to manage health-related needs will improve Outcome: Progressing

## 2024-04-20 NOTE — Progress Notes (Signed)
 Per Dr. Dino no need to continue doing Q4H NIH scales

## 2024-04-20 NOTE — TOC Progression Note (Signed)
 Transition of Care [facility]) - Progression Note    Patient Details  Name: Joseph Gentry MRN: [id number] Date of Birth: 04/15/1951  [facility] CM/SW Contact  Hoy DELENA Bigness, LCSW Phone Number: 04/20/2024, 1:20 PM  Clinical Narrative:     CSW spoke with pt's daughter regarding increasing home aide hours, HH, and rehab for alcohol  use. CSW encouraged pt's daughter to reach out to his Medicaid case worker to discuss having him evaluated for increase in hours. CSW also reviewed pt's functioning status during PT evaluation and not qualifying for [facility] services at this time. CSW provided substance use resources and have faxed referral to [facility] for possible rehab placement. Phone number to [facility] provided to pt for pt to call to complete screening. Daughter not at bedside when number provided to pt. CSW will follow up with pt/daughter again tomorrow.                      Expected Discharge Plan and Services                                               Social Drivers of Health (SDOH) Interventions SDOH Screenings   Food Insecurity: No Food Insecurity (04/18/2024)  Housing: Low Risk  (04/18/2024)  Transportation Needs: No Transportation Needs (04/18/2024)  Utilities: Not At Risk (04/18/2024)  Social Connections: Unknown (04/18/2024)  Tobacco Use: Medium Risk (04/18/2024)    Readmission Risk Interventions     No data to display

## 2024-04-21 DIAGNOSIS — R471 Dysarthria and anarthria: Secondary | ICD-10-CM | POA: Diagnosis not present

## 2024-04-21 MED ORDER — INFLUENZA VAC SPLIT HIGH-DOSE 0.5 ML IM SUSY
0.5000 mL | PREFILLED_SYRINGE | INTRAMUSCULAR | Status: AC
  Administered 2024-04-21: 0.5 mL via INTRAMUSCULAR
  Filled 2024-04-21: qty 0.5

## 2024-04-21 MED ORDER — VITAMIN B-1 100 MG PO TABS: 100.0000 mg | ORAL_TABLET | Freq: Every day | ORAL | 0 refills | Status: AC

## 2024-04-21 MED ORDER — FOLIC ACID 1 MG PO TABS: 1.0000 mg | ORAL_TABLET | Freq: Every day | ORAL | 0 refills | Status: AC

## 2024-04-21 NOTE — Care Management Important Message (Signed)
 Important Message  Patient Details  Name: CEBASTIAN NEIS MRN: [id number] Date of Birth: 1950/11/28   Important Message Given:  N/A - LOS <3 / Initial given by admissions     Londan Coplen L Erla Bacchi 04/21/2024, 11:25 AM

## 2024-04-21 NOTE — Plan of Care (Signed)

## 2024-04-21 NOTE — Progress Notes (Signed)
 Mobility Specialist Progress Note:    04/21/24 1000  Mobility  Activity Ambulated with assistance;Pivoted/transferred from bed to chair  Level of Assistance Standby assist, set-up cues, supervision of patient - no hands on  Assistive Device None  Distance Ambulated (ft) 10 ft  Range of Motion/Exercises Active;All extremities  Activity Response Tolerated well  Mobility Referral Yes  Mobility visit 1 Mobility  Mobility Specialist Start Time (ACUTE ONLY) 1000  Mobility Specialist Stop Time (ACUTE ONLY) 1015  Mobility Specialist Time Calculation (min) (ACUTE ONLY) 15 min   Pt received in bed, NT and family in room. Agreeable to sit up in chair, required SBA to stand and ambulate with no AD. Tolerated well, asx throughout. All needs met.  Benigno Check Mobility Specialist Please contact via Special Educational Needs Teacher or  Rehab office at [phone]

## 2024-04-21 NOTE — TOC Transition Note (Signed)
 Transition of Care [facility]) - Discharge Note   Patient Details  Name: Joseph Gentry MRN: [id number] Date of Birth: 1951-03-29  Transition of Care [facility]) CM/SW Contact:  Hoy DELENA Bigness, LCSW Phone Number: 04/21/2024, 11:20 AM   Clinical Narrative:    CSW met with pt/daughter as pt's daughter concerned about pt not discharging directly to a rehab facility. Pt's daughter shares that several agencies on list have special requirements that pt does not meet and some are no longer in service. CSW discussed with daughter that pt is unable to remain in hospital once he has been deemed medically stable for discharge and that rehab placement will have to be worked on at an outpatient level. Pt's daughter to call pt's insurance for further assistance.          Patient Goals and CMS Choice            Discharge Placement                       Discharge Plan and Services Additional resources added to the After Visit Summary for                                       Social Drivers of Health (SDOH) Interventions SDOH Screenings   Food Insecurity: No Food Insecurity (04/18/2024)  Housing: Low Risk  (04/18/2024)  Transportation Needs: No Transportation Needs (04/18/2024)  Utilities: Not At Risk (04/18/2024)  Social Connections: Unknown (04/18/2024)  Tobacco Use: Medium Risk (04/18/2024)     Readmission Risk Interventions     No data to display

## 2024-04-21 NOTE — Progress Notes (Signed)
   04/21/24 1340  Oxygen Therapy  SpO2 90 %  O2 Device Room Air (prior to discharge, patient reports he only uses home O2 at night and/or as needed.)  Patient Activity (if Appropriate) In chair  Pulse Oximetry Type Intermittent   Patient reported that he has home O2 at home along with portable O2 at home but did not have with him for transport home. Patient stated he only uses home O2 at night and/or as needed and would be okay for ride home. Patient O2 sats at 90% on r/a prior to discharge. Daughter confirmed this is correct and he would not need O2 for ride home. O2 tank offered for use and have it returned but patient and daughter declined stating this was not needed.

## 2024-04-21 NOTE — Plan of Care (Signed)
  Problem: Education: Goal: Knowledge of General Education information will improve Description: Including pain rating scale, medication(s)/side effects and non-pharmacologic comfort measures Outcome: Progressing   Problem: Health Behavior/Discharge Planning: Goal: Ability to manage health-related needs will improve Outcome: Progressing   Problem: Clinical Measurements: Goal: Ability to maintain clinical measurements within normal limits will improve Outcome: Progressing Goal: Respiratory complications will improve Outcome: Progressing Goal: Cardiovascular complication will be avoided Outcome: Progressing   Problem: Clinical Measurements: Goal: Respiratory complications will improve Outcome: Progressing

## 2024-04-21 NOTE — Discharge Summary (Signed)
 Physician Discharge Summary   Patient: Joseph Gentry MRN: [id number] DOB: Jan 06, 1951  Admit date:     04/18/2024  Discharge date: 04/21/24  Discharge Physician: Deliliah Room   PCP: Carlette Benita Area, MD   Recommendations at discharge:    F/u with PCP in one week  F/u with spine surgeon in a month for lumbar radiculopathy.  Discharge Diagnoses: Principal Problem:   Dysarthria   Hospital Course:  73 y.o. male with medical history significant of COPD, GERD, alcohol  abuse, BPH, tobacco abuse who presents emergency department via EMS due to a fall and loss of consciousness.  Has normal CT as well as CT angio head and neck.  Echo and MRI of the brain are unremarkable.  PT/OT evaluations done.  He lives in an apartment and has an engineer, production.   Dysarthria, POA: Likely TIA, Resolved now.  CT of the head as well as CT angio of the head and neck are within normal limits.  Follow-up MRI of the brain- No acute ischemic stroke. PT/OT/SLP evaluations done. Aspirin 325 mg given in the emergency room, continue with aspirin 81 mg daily. Continue with thiamine and folate.  Back and right sided leg pain,POA: chronic, MRI lumbar spine showed chronic compression fractures of L3, L4 and likely L5.  There is no acute fracture. There is a broad-based disc osteophyte resulting in moderate spinal and severe foraminal narrowing most notably at L4-5. Correlation for L4 radiculopathy, right greater than left. There is mild-to-moderate foraminal narrowing at L3-4.  Advised to follow up outpatient with spine surgeon.   Physical deconditioning/fall at home, POA: Consulted PT/OT   Right forehead contusion secondary to fall, POA: Fall precautions   COPD, POA: Not in exacerbation.  Continue with inhalation bronchodilator therapy   Alcohol  abuse,POA: counseled extensively. Case management provided resources to his daughter and she will call and set up an appointment.   Chronic hypoxia respiratory failure, POA:  Non-complaint.He does have oxygen at home.   GERD,POA: Continue with Protonix    BPH, POA: Continue with Flomax    Hypertension, POA: continue with metoprolol and check BP daily.  Disposition: Home, has an aide. Daughter will reach out to his Medicaid case worker to discuss having him evaluated for increase in aide hours   Discussed in length with the patient's daughter at the bedside.       Consultants: Tele neurology Procedures performed: None  Disposition: Home Diet recommendation:  Cardiac diet DISCHARGE MEDICATION: Allergies as of 04/21/2024   No Known Allergies      Medication List     TAKE these medications    albuterol  (2.5 MG/3ML) 0.083% nebulizer solution Commonly known as: PROVENTIL  Take 3 mLs (2.5 mg total) by nebulization every 6 (six) hours as needed for wheezing or shortness of breath.   albuterol  108 (90 Base) MCG/ACT inhaler Commonly known as: VENTOLIN  HFA Inhale 2 puffs into the lungs every 4 (four) hours as needed.   cetirizine  10 MG tablet Commonly known as: ZYRTEC  Take 1 tablet (10 mg total) by mouth daily. What changed:  when to take this reasons to take this   fluticasone  50 MCG/ACT nasal spray Commonly known as: FLONASE  Place 2 sprays into both nostrils daily. What changed:  when to take this reasons to take this   folic acid  1 MG tablet Commonly known as: FOLVITE  Take 1 tablet (1 mg total) by mouth daily. Start taking on: April 22, 2024   metoprolol tartrate 25 MG tablet Commonly known as: LOPRESSOR Take 25 mg by  mouth 2 (two) times daily.   mirtazapine 15 MG tablet Commonly known as: REMERON Take 15 mg by mouth at bedtime.   OXYGEN Inhale 2 L into the lungs daily.   pantoprazole  40 MG tablet Commonly known as: PROTONIX  Take 40 mg by mouth daily.   tamsulosin  0.4 MG Caps capsule Commonly known as: FLOMAX  TAKE ONE CAPSULE BY MOUTH TWICE DAILY What changed: Another medication with the same name was removed. Continue  taking this medication, and follow the directions you see here.   thiamine 100 MG tablet Commonly known as: Vitamin B-1 Take 1 tablet (100 mg total) by mouth daily. Start taking on: April 24, 2024   Trelegy Ellipta  200-62.5-25 MCG/ACT Aepb Generic drug: Fluticasone -Umeclidin-Vilant Inhale 1 puff into the lungs daily.   Trelegy Ellipta  100-62.5-25 MCG/ACT Aepb Generic drug: Fluticasone -Umeclidin-Vilant Inhale 1 puff into the lungs daily.        Follow-up Information     Fanta, Benita Area, MD. Schedule an appointment as soon as possible for a visit in 1 week(s).   Specialty: Internal Medicine Contact information: [address] [place] [address] [phone]         [facility]. Schedule an appointment as soon as possible for a visit in 1 month(s).   Specialty: Neurosurgery Contact information: [address] [place] [address] [phone]                Discharge Exam: Fredricka Weights   04/18/24 1825  Weight: 69.6 kg   Constitutional: NAD, calm, comfortable Eyes: PERRL, lids and conjunctivae normal ENMT: Mucous membranes are moist. Posterior pharynx clear of any exudate or lesions.Normal dentition.  Neck: normal, supple, no masses, no thyromegaly Respiratory: clear to auscultation bilaterally, no wheezing, no crackles. Normal respiratory effort. No accessory muscle use.  Cardiovascular: Regular rate and rhythm, no murmurs / rubs / gallops. No extremity edema. 2+ pedal pulses. No carotid bruits.  Abdomen: no tenderness, no masses palpated. No hepatosplenomegaly. Bowel sounds positive.  Musculoskeletal: no clubbing / cyanosis. No joint deformity upper and lower extremities. Good ROM, no contractures. Normal muscle tone.  Skin: no rashes, lesions, ulcers. No induration Neurologic: CN 2-12 grossly intact. Sensation intact, DTR normal. Strength 5/5 x all 4 extremities.  Psychiatric: Normal  judgment and insight. Alert and oriented x 3. Normal mood.    Condition at discharge: good  The results of significant diagnostics from this hospitalization (including imaging, microbiology, ancillary and laboratory) are listed below for reference.   Imaging Studies: ECHOCARDIOGRAM COMPLETE Result Date: 04/20/2024    ECHOCARDIOGRAM REPORT   Patient Name:   KOTARO BUER Date of Exam: 04/20/2024 Medical Rec #:  [id number]      Height:       68.0 in Accession #:    [id number]     Weight:       153.4 lb Date of Birth:  03/28/1951      BSA:          1.826 m Patient Age:    73 years       BP:           111/73 mmHg Patient Gender: M              HR:           88 bpm. Exam Location:  [facility] Procedure: 2D Echo, Color Doppler and Cardiac Doppler (Both Spectral and Color  Flow Doppler were utilized during procedure). Indications:    Stroke I63.9  History:        Patient has no prior history of Echocardiogram examinations.                 COPD; Risk Factors:Current Smoker.  Sonographer:    BERNARDA ROCKS Referring Phys: [id number] OLADAPO ADEFESO IMPRESSIONS  1. Images are limited.  2. Left ventricular ejection fraction, by estimation, is 65 to 70%. The left ventricle has normal function. Left ventricular endocardial border not optimally defined to evaluate regional wall motion. Left ventricular diastolic parameters are indeterminate.  3. Right ventricular systolic function is normal. The right ventricular size is mildly enlarged. Tricuspid regurgitation signal is inadequate for assessing PA pressure.  4. The mitral valve is grossly normal. No evidence of mitral valve regurgitation.  5. The aortic valve has an indeterminant number of cusps. Aortic valve regurgitation is not visualized. Aortic valve sclerosis is present, with no evidence of aortic valve stenosis. Aortic valve mean gradient measures 1.0 mmHg.  6. The inferior vena cava is normal in size with greater than 50% respiratory variability, suggesting  right atrial pressure of 3 mmHg. Comparison(s): No prior Echocardiogram. FINDINGS  Left Ventricle: Left ventricular ejection fraction, by estimation, is 65 to 70%. The left ventricle has normal function. Left ventricular endocardial border not optimally defined to evaluate regional wall motion. The left ventricular internal cavity size was normal in size. There is no left ventricular hypertrophy. Left ventricular diastolic parameters are indeterminate. Right Ventricle: The right ventricular size is mildly enlarged. No increase in right ventricular wall thickness. Right ventricular systolic function is normal. Tricuspid regurgitation signal is inadequate for assessing PA pressure. Left Atrium: Left atrial size was normal in size. Right Atrium: Right atrial size was normal in size. Pericardium: There is no evidence of pericardial effusion. Presence of epicardial fat layer. Mitral Valve: The mitral valve is grossly normal. Mild mitral annular calcification. No evidence of mitral valve regurgitation. MV peak gradient, 2.7 mmHg. The mean mitral valve gradient is 1.0 mmHg. Tricuspid Valve: The tricuspid valve is grossly normal. Tricuspid valve regurgitation is trivial. Aortic Valve: The aortic valve has an indeterminant number of cusps. There is mild aortic valve annular calcification. Aortic valve regurgitation is not visualized. Aortic valve sclerosis is present, with no evidence of aortic valve stenosis. Aortic valve mean gradient measures 1.0 mmHg. Aortic valve peak gradient measures 3.4 mmHg. Aortic valve area, by VTI measures 3.80 cm. Pulmonic Valve: The pulmonic valve was grossly normal. Pulmonic valve regurgitation is trivial. Aorta: The aortic root and ascending aorta are structurally normal, with no evidence of dilitation. Venous: The inferior vena cava is normal in size with greater than 50% respiratory variability, suggesting right atrial pressure of 3 mmHg. IAS/Shunts: No atrial level shunt detected by color  flow Doppler. Additional Comments: 3D was performed not requiring image post processing on an independent workstation and was indeterminate.  LEFT VENTRICLE PLAX 2D LVIDd:         4.00 cm      Diastology LVIDs:         2.60 cm      LV e' medial:    10.80 cm/s LV PW:         0.80 cm      LV E/e' medial:  6.3 LV IVS:        0.80 cm      LV e' lateral:   11.40 cm/s LVOT diam:     2.20 cm  LV E/e' lateral: 6.0 LV SV:         57 LV SV Index:   31 LVOT Area:     3.80 cm  LV Volumes (MOD) LV vol d, MOD A2C: 116.0 ml LV vol d, MOD A4C: 87.9 ml LV vol s, MOD A2C: 25.9 ml LV vol s, MOD A4C: 29.3 ml LV SV MOD A2C:     90.1 ml LV SV MOD A4C:     87.9 ml LV SV MOD BP:      84.9 ml RIGHT VENTRICLE RV Basal diam:  3.40 cm RV S prime:     13.50 cm/s TAPSE (M-mode): 2.3 cm LEFT ATRIUM             Index        RIGHT ATRIUM           Index LA diam:        2.10 cm 1.15 cm/m   RA Area:     10.20 cm LA Vol (A2C):   19.6 ml 10.73 ml/m  RA Volume:   17.90 ml  9.80 ml/m LA Vol (A4C):   29.5 ml 16.16 ml/m LA Biplane Vol: 25.5 ml 13.96 ml/m  AORTIC VALVE                    PULMONIC VALVE AV Area (Vmax):    3.18 cm     PV Vmax:          0.82 m/s AV Area (Vmean):   3.48 cm     PV Peak grad:     2.7 mmHg AV Area (VTI):     3.80 cm     PR End Diast Vel: 2.04 msec AV Vmax:           92.40 cm/s AV Vmean:          50.600 cm/s AV VTI:            0.149 m AV Peak Grad:      3.4 mmHg AV Mean Grad:      1.0 mmHg LVOT Vmax:         77.40 cm/s LVOT Vmean:        46.300 cm/s LVOT VTI:          0.149 m LVOT/AV VTI ratio: 1.00  AORTA Ao Root diam: 3.50 cm Ao Asc diam:  3.30 cm MITRAL VALVE MV Area (PHT): 3.91 cm    SHUNTS MV Area VTI:   2.57 cm    Systemic VTI:  0.15 m MV Peak grad:  2.7 mmHg    Systemic Diam: 2.20 cm MV Mean grad:  1.0 mmHg MV Vmax:       0.82 m/s MV Vmean:      53.9 cm/s MV Decel Time: 194 msec MV E velocity: 68.10 cm/s MV A velocity: 83.30 cm/s MV E/A ratio:  0.82 Jayson Sierras MD Electronically signed by Jayson Sierras  MD Signature Date/Time: 04/20/2024/1:26:10 PM    Final    MR HIP RIGHT WO CONTRAST Result Date: 04/20/2024 MR HIP WITHOUT IV CONTRAST COMPARISON: None. CLINICAL HISTORY: Right hip pain. Low back pain. PULSE SEQUENCES: AX T1, Ax T2 FS, Cor T1, COR STIR & SMALL FOV COR PD FS without contrast. FINDINGS: Bones and labrum: There is no fracture or contusion pattern. Mild degenerative arthrosis is present without accelerated arthrosis. Degenerative changes are seen in the anterior labrum. No displaced labral tear is appreciated. Pelvis, sacrum and SI joints are unremarkable. Degenerative spondylosis is seen  in the lumbar spine with chronic compression deformities. Musculotendinous structures: No significant tendinosis, myositis or bursal collection. Mild left gluteus medius and minimus tendinosis. IMPRESSION: Mild degenerative arthrosis without accelerated arthrosis or acute abnormality. Mild degenerative changes in the anterior superior labrum without a displaced tear. No significant musculotendinous abnormality. Electronically signed by: Norleen Satchel MD 04/20/2024 12:22 PM EST RP Workstation: [id number]   MR LUMBAR SPINE WO CONTRAST Result Date: 04/20/2024 MR LUMBAR SPINE WITHOUT IV CONTRAST COMPARISON: None available CLINICAL HISTORY: Low back pain, trauma right lower extremity pain. TECHNIQUE: SAG T2, SAG T1, SAG STIR, AX T2, AX T1 without IV contrast. FINDINGS: There is normal alignment of the lumbar spine. There are multiple chronic compression fractures of the lumbar spine most notably of the L3 and L4 vertebral bodies. There is sacralization of the L5 vertebral body. The lowest rectangular vertebral body is labeled L5 for the purposes of this examination. There is no acute vertebral body height loss, subluxation or marrow replacing process. The sacrum and SI joints are unremarkable so far as visualized. Conus and cauda equina are unremarkable. T12-L1: There is no focal disc protrusion, foraminal or spinal  stenosis. L1-2: Mild disc desiccation without focal protrusion, foraminal or spinal stenosis. Mild facet arthrosis. L2-3: Mild broad-based bulge slightly effacing the ventral thecal sac. There is no significant spinal or foraminal stenosis. Mild-to-moderate facet arthrosis is present. L3-4: Broad-based disc osteophyte and facet arthrosis slightly effacing the ventral thecal sac. No significant spinal stenosis. There is mild caudal foraminal narrowing bilaterally, right slightly greater than left. L4-5: Moderate broad-based bulge effacing the ventral thecal sac likely impinging the descending nerve roots in the lateral recess. There is severe bilateral foraminal narrowing, right greater than left. Correlation for L4 radiculopathy. L5-S1: Mild disc desiccation without foraminal or spinal stenosis. Mild facet arthrosis. The retroperitoneal structures demonstrate no significant abnormality. IMPRESSION: Chronic compression fractures of the L3, L4 and likely L5 vertebral bodies. There is no acute fracture. There is a broad-based disc osteophyte resulting in moderate spinal and severe foraminal narrowing most notably at L4-5. Correlation for L4 radiculopathy, right greater than left. There is mild-to-moderate foraminal narrowing at L3-4. See above for more detail at each individual level. Electronically signed by: Norleen Satchel MD 04/20/2024 12:21 PM EST RP Workstation: [id number]   DG HIP UNILAT WITH PELVIS 2-3 VIEWS LEFT Result Date: 04/19/2024 EXAM: 2 OR MORE VIEW(S) XRAY OF THE LEFT UNILATERAL HIP 04/19/2024 10:20:00 AM COMPARISON: CT pelvis 12/18/2015. CLINICAL HISTORY: Hip pain, bilateral. FINDINGS: BONES AND JOINTS: No acute fracture or focal osseous lesion. Minimal spurring of the left acetabulum. Mild spurring of the left femoral head. The hip joint is maintained. SOFT TISSUES: The soft tissues are unremarkable. IMPRESSION: 1. No acute osseous abnormality. 2. Minimal spurring of the left acetabulum. 3. Mild  spurring of the left femoral head. Electronically signed by: Ryan Salvage MD 04/19/2024 06:27 PM EST RP Workstation: [id number]   DG HIP UNILAT WITH PELVIS 2-3 VIEWS RIGHT Result Date: 04/19/2024 EXAM: 2 or more VIEW(S) XRAY OF THE RIGHT HIP 04/19/2024 10:20:00 AM COMPARISON: CT pelvis 09/12/2018. CLINICAL HISTORY: Hip pain, bilateral. FINDINGS: BONES AND JOINTS: No fracture or acute bony findings. The hip joint is maintained. No significant degenerative changes. SOFT TISSUES: High density in the urinary bladder compatible with contrast medium. The soft tissues are unremarkable. IMPRESSION: 1. No acute bony findings. Electronically signed by: Ryan Salvage MD 04/19/2024 06:25 PM EST RP Workstation: [id number]   MR BRAIN WO CONTRAST Result Date: 04/19/2024 EXAM: MRI BRAIN WITHOUT CONTRAST  04/19/2024 09:55:05 AM TECHNIQUE: Multiplanar multisequence MRI of the head/brain was performed without the administration of intravenous contrast. COMPARISON: Head CT and CTA 04/18/2024. CLINICAL HISTORY: Neuro deficit, acute, stroke suspected. FINDINGS: BRAIN AND VENTRICLES: There is no evidence of an acute infarct, intracranial hemorrhage, mass, midline shift, hydrocephalus, or extra-axial fluid collection. Confluent T2 hyperintensities in the cerebral white matter bilaterally are nonspecific but compatible with severe chronic small vessel ischemic disease. There is moderate, central predominant cerebral atrophy. Major intracranial vascular flow voids are preserved. The sella is unremarkable. ORBITS: No acute abnormality. SINUSES AND MASTOIDS: No acute abnormality. BONES AND SOFT TISSUES: Normal marrow signal. IMPRESSION: 1. No acute intracranial abnormality. 2. Severe chronic small vessel ischemic disease. Electronically signed by: Dasie Hamburg MD 04/19/2024 10:04 AM EST RP Workstation: [id number]   CT ANGIO HEAD NECK W WO CM W PERF (CODE STROKE) Result Date: 04/18/2024 EXAM: CT ANGIOGRAPHY OF THE HEAD AND NECK  CT PERFUSION BRAIN 04/18/2024 07:16:13 PM TECHNIQUE: Multidetector CT imaging of the head and neck was performed using the standard protocol during bolus administration of intravenous contrast. 3D postprocessing with multiplanar reconstructions and MIPs was performed to evaluate the vascular anatomy. Carotid stenosis measurements (when applicable) are obtained utilizing NASCET criteria, using the distal internal carotid diameter as the denominator. Cerebral perfusion analysis using computed tomography with contrast administration, including post-processing of parametric maps with determination of cerebral blood flow, cerebral blood volume, mean transit time and time-to-maximum. RADIATION DOSE REDUCTION: This exam was performed according to the departmental dose-optimization program which includes automated exposure control, adjustment of the mA and/or kV according to patient size and/or use of iterative reconstruction technique. CONTRAST: Without and with IV contrast, 100mL (iohexol (OMNIPAQUE) 350 MG/ML injection 100 mL IOHEXOL 350 MG/ML SOLN) COMPARISON: Same day CT head. CLINICAL HISTORY: Neuro deficit, acute, stroke suspected FINDINGS: AORTIC ARCH AND ARCH VESSELS: No dissection or arterial injury. No significant stenosis of the brachiocephalic or subclavian arteries. CERVICAL CAROTID ARTERIES: No dissection, arterial injury, or hemodynamically significant stenosis by NASCET criteria. CERVICAL VERTEBRAL ARTERIES: No dissection, arterial injury, or significant stenosis. LUNGS AND MEDIASTINUM: Unremarkable. SOFT TISSUES: No acute abnormality. BONES: No acute abnormality. ANTERIOR CIRCULATION: No significant stenosis of the internal carotid arteries. No significant stenosis of the anterior cerebral arteries. No significant stenosis of the middle cerebral arteries. No aneurysm. POSTERIOR CIRCULATION: No significant stenosis of the posterior cerebral arteries. No significant stenosis of the basilar artery. No  significant stenosis of the vertebral arteries. No aneurysm. OTHER: No dural venous sinus thrombosis on this non-dedicated study. EXAM QUALITY: Exam quality is adequate with diagnostic perfusion maps. No significant motion artifact. Appropriate arterial inflow and venous outflow curves. CORE INFARCT (CBF<30% volume): 0 mL TOTAL HYPOPERFUSION (Tmax>6s volume): 9 mL Mismatch volume: 9 mL Mismatch ratio: infinite Location: Cerebellum IMPRESSION: 1. No acute large vessel occlusion. 2. No hemodynamically significant stenosis. 3. On CT perfusion, 9 mL of reported penumbra in the cerebellum which is indeterminate but favored artifactual. MRI could better evaluate for acute infarct if clinically warranted. Electronically signed by: Gilmore Molt MD 04/18/2024 07:50 PM EST RP Workstation: [id number]   CT Cervical Spine Wo Contrast Result Date: 04/18/2024 EXAM: CT CERVICAL SPINE WITHOUT CONTRAST 04/18/2024 06:43:38 PM TECHNIQUE: CT of the cervical spine was performed without the administration of intravenous contrast. Multiplanar reformatted images are provided for review. Automated exposure control, iterative reconstruction, and/or weight based adjustment of the mA/kV was utilized to reduce the radiation dose to as low as reasonably achievable. COMPARISON: None available. CLINICAL HISTORY: Neck trauma (  Age >= 65y). FINDINGS: CERVICAL SPINE: BONES AND ALIGNMENT: Straightening/mild reversal of the normal cervical lordosis. Grade 1 anterolisthesis of C3 on C4, C4 on C5, and C7 on T1. No acute fracture or suspicious lesion. DEGENERATIVE CHANGES: Diffuse cervical disc degeneration, with disc space narrowing and degenerative endplate changes being greatest at C5-C6 and C6-C7. Asymmetrically advanced right facet arthrosis at C2-C3 and C3-C4 and left facet arthrosis at C4-C5 with resultant neural foraminal stenosis at these levels. No evidence of high grade spinal canal stenosis. SOFT TISSUES: No prevertebral soft tissue  swelling. Emphysema in the included lung apices. IMPRESSION: 1. No acute cervical spine fracture. 2. Advanced cervical disc and facet degeneration. 3. Emphysema; consider evaluation for eligibility for low-dose CT lung cancer screening program. Electronically signed by: Dasie Hamburg MD 04/18/2024 06:59 PM EST RP Workstation: [id number]   CT HEAD CODE STROKE WO CONTRAST (LKW 0-4.5h, LVO 0-24h) Result Date: 04/18/2024 EXAM: CT HEAD WITHOUT 04/18/2024 06:43:38 PM TECHNIQUE: CT of the head was performed without the administration of intravenous contrast. Automated exposure control, iterative reconstruction, and/or weight based adjustment of the mA/kV was utilized to reduce the radiation dose to as low as reasonably achievable. COMPARISON: None available. CLINICAL HISTORY: Neuro deficit, acute, stroke suspected. Fall and slurred speech. FINDINGS: BRAIN AND VENTRICLES: There is no evidence of an acute infarct, intracranial hemorrhage, mass, midline shift, hydrocephalus, or extra-axial fluid collection. Confluent hypodensities in the cerebral white matter bilaterally are nonspecific but compatible with severe chronic small vessel ischemic disease. There is mild to moderate central predominant cerebral atrophy. Calcified atherosclerosis at the skull base. ORBITS: No acute abnormality. SINUSES AND MASTOIDS: Partially visualized chronic deformities of the posterior walls of the left greater than right maxillary sinuses. Mild bilateral ethmoid sinus mucosal thickening. Clear mastoid air cells. SOFT TISSUES AND SKULL: No acute skull fracture. Mild right frontal scalp soft tissue swelling. [place] Stroke Program Early CT Score (ASPECTS) ----- Ganglionic (caudate, IC, lentiform nucleus, insula, M1-M3): 7 Supraganglionic (M4-M6): 3 Total: 10 Findings were communicated by telephone to Dr. Bernardino Fireman on 04/18/2024 at 6:49 pm. IMPRESSION: 1. No acute intracranial abnormality. ASPECTS of 10. 2. Severe chronic small vessel ischemic  disease. Electronically signed by: Dasie Hamburg MD 04/18/2024 06:52 PM EST RP Workstation: [id number]    Microbiology: Results for orders placed or performed during the hospital encounter of 10/20/19  SARS CORONAVIRUS 2 (TAT 6-24 HRS) Nasopharyngeal Nasopharyngeal Swab     Status: None   Collection Time: 10/20/19 10:32 AM   Specimen: Nasopharyngeal Swab  Result Value Ref Range Status   SARS Coronavirus 2 NEGATIVE NEGATIVE Final    Comment: (NOTE) SARS-CoV-2 target nucleic acids are NOT DETECTED. The SARS-CoV-2 RNA is generally detectable in upper and lower respiratory specimens during the acute phase of infection. Negative results do not preclude SARS-CoV-2 infection, do not rule out co-infections with other pathogens, and should not be used as the sole basis for treatment or other patient management decisions. Negative results must be combined with clinical observations, patient history, and epidemiological information. The expected result is Negative. Fact Sheet for Patients: hairslick.no Fact Sheet for Healthcare Providers: quierodirigir.com This test is not yet approved or cleared by the [place]  FDA and  has been authorized for detection and/or diagnosis of SARS-CoV-2 by FDA under an Emergency Use Authorization (EUA). This EUA will remain  in effect (meaning this test can be used) for the duration of the COVID-19 declaration under Section 56 4(b)(1) of the Act, 21 U.S.C. section 360bbb-3(b)(1), unless the authorization is terminated  or revoked sooner. Performed at [facility], [address] [place], [place] [address]     Labs: CBC: Recent Labs  Lab 04/18/24 1830 04/18/24 1831 04/19/24 0442  WBC 8.6  --  10.1  NEUTROABS 4.7  --   --   HGB 15.3 16.3 15.7  HCT 44.2 48.0 45.4  MCV 100.5*  --  99.8  PLT 286  --  295   Basic Metabolic Panel: Recent Labs  Lab 04/18/24 1831 04/18/24 1939  04/19/24 0442  NA 137 134* 139  K 3.9 3.4* 4.0  CL 99 98 102  CO2  --  23 21*  GLUCOSE 128* 97 98  BUN <3* <5* <5*  CREATININE 1.00 0.69 0.62  CALCIUM  --  8.7* 8.8*  MG  --  1.7 2.3  PHOS  --   --  3.0   Liver Function Tests: Recent Labs  Lab 04/18/24 1939 04/19/24 0442  AST 19 20  ALT 10 10  ALKPHOS 96 104  BILITOT 0.3 0.3  PROT 6.5 6.7  ALBUMIN 4.0 4.0   CBG: Recent Labs  Lab 04/18/24 1828  GLUCAP 126*    Discharge time spent: 38 minutes.  Signed: Deliliah Room, MD [facility] 04/21/2024

## 2024-04-22 LAB — VITAMIN B1: Vitamin B1 (Thiamine): 84.6 nmol/L (ref 66.5–200.0)

## 2024-10-07 ENCOUNTER — Other Ambulatory Visit

## 2024-10-21 ENCOUNTER — Ambulatory Visit: Admitting: Urology
